# Patient Record
Sex: Female | Born: 1965 | Race: White | Hispanic: No | Smoking: Current every day smoker
Health system: Southern US, Community
[De-identification: ages and names within clinical notes are randomized; demographics above are authoritative.]

## PROBLEM LIST (undated history)

## (undated) DIAGNOSIS — M1990 Unspecified osteoarthritis, unspecified site: Secondary | ICD-10-CM

## (undated) DIAGNOSIS — E785 Hyperlipidemia, unspecified: Secondary | ICD-10-CM

## (undated) DIAGNOSIS — N3010 Interstitial cystitis (chronic) without hematuria: Secondary | ICD-10-CM

## (undated) DIAGNOSIS — F4310 Post-traumatic stress disorder, unspecified: Secondary | ICD-10-CM

## (undated) DIAGNOSIS — K449 Diaphragmatic hernia without obstruction or gangrene: Secondary | ICD-10-CM

## (undated) DIAGNOSIS — F419 Anxiety disorder, unspecified: Secondary | ICD-10-CM

## (undated) DIAGNOSIS — I10 Essential (primary) hypertension: Secondary | ICD-10-CM

## (undated) DIAGNOSIS — G43909 Migraine, unspecified, not intractable, without status migrainosus: Secondary | ICD-10-CM

## (undated) DIAGNOSIS — J449 Chronic obstructive pulmonary disease, unspecified: Secondary | ICD-10-CM

## (undated) DIAGNOSIS — R296 Repeated falls: Secondary | ICD-10-CM

## (undated) DIAGNOSIS — J189 Pneumonia, unspecified organism: Secondary | ICD-10-CM

## (undated) DIAGNOSIS — G8929 Other chronic pain: Secondary | ICD-10-CM

## (undated) DIAGNOSIS — F329 Major depressive disorder, single episode, unspecified: Secondary | ICD-10-CM

## (undated) DIAGNOSIS — W3400XA Accidental discharge from unspecified firearms or gun, initial encounter: Secondary | ICD-10-CM

## (undated) DIAGNOSIS — IMO0002 Reserved for concepts with insufficient information to code with codable children: Secondary | ICD-10-CM

## (undated) DIAGNOSIS — E119 Type 2 diabetes mellitus without complications: Secondary | ICD-10-CM

## (undated) DIAGNOSIS — K219 Gastro-esophageal reflux disease without esophagitis: Secondary | ICD-10-CM

## (undated) DIAGNOSIS — M549 Dorsalgia, unspecified: Secondary | ICD-10-CM

## (undated) DIAGNOSIS — R0602 Shortness of breath: Secondary | ICD-10-CM

## (undated) DIAGNOSIS — F32A Depression, unspecified: Secondary | ICD-10-CM

## (undated) HISTORY — PX: FOOT SURGERY: SHX648

## (undated) HISTORY — PX: ANTERIOR CERVICAL DECOMP/DISCECTOMY FUSION: SHX1161

## (undated) HISTORY — PX: DILATION AND CURETTAGE OF UTERUS: SHX78

---

## 1989-08-14 HISTORY — PX: TUBAL LIGATION: SHX77

## 1989-08-14 HISTORY — PX: ABDOMINAL HYSTERECTOMY: SHX81

## 1999-08-15 HISTORY — PX: CARPAL TUNNEL RELEASE: SHX101

## 2001-01-07 ENCOUNTER — Ambulatory Visit (HOSPITAL_COMMUNITY): Admission: RE | Admit: 2001-01-07 | Discharge: 2001-01-07 | Payer: Self-pay | Admitting: *Deleted

## 2001-01-19 ENCOUNTER — Encounter: Admission: RE | Admit: 2001-01-19 | Discharge: 2001-03-01 | Payer: Self-pay | Admitting: Orthopedic Surgery

## 2001-01-24 ENCOUNTER — Ambulatory Visit (HOSPITAL_BASED_OUTPATIENT_CLINIC_OR_DEPARTMENT_OTHER): Admission: RE | Admit: 2001-01-24 | Discharge: 2001-01-24 | Payer: Self-pay | Admitting: *Deleted

## 2001-02-09 ENCOUNTER — Ambulatory Visit (HOSPITAL_BASED_OUTPATIENT_CLINIC_OR_DEPARTMENT_OTHER): Admission: RE | Admit: 2001-02-09 | Discharge: 2001-02-09 | Payer: Self-pay | Admitting: *Deleted

## 2001-03-05 ENCOUNTER — Inpatient Hospital Stay (HOSPITAL_COMMUNITY): Admission: EM | Admit: 2001-03-05 | Discharge: 2001-03-09 | Payer: Self-pay | Admitting: *Deleted

## 2003-07-06 ENCOUNTER — Encounter: Payer: Self-pay | Admitting: Emergency Medicine

## 2003-07-06 ENCOUNTER — Inpatient Hospital Stay (HOSPITAL_COMMUNITY): Admission: EM | Admit: 2003-07-06 | Discharge: 2003-07-09 | Payer: Self-pay | Admitting: *Deleted

## 2006-01-24 ENCOUNTER — Emergency Department (HOSPITAL_COMMUNITY): Admission: EM | Admit: 2006-01-24 | Discharge: 2006-01-24 | Payer: Self-pay | Admitting: Emergency Medicine

## 2006-04-20 ENCOUNTER — Ambulatory Visit: Payer: Self-pay | Admitting: Nurse Practitioner

## 2006-05-18 ENCOUNTER — Ambulatory Visit: Payer: Self-pay | Admitting: Nurse Practitioner

## 2006-05-24 ENCOUNTER — Ambulatory Visit: Payer: Self-pay | Admitting: Nurse Practitioner

## 2006-06-09 ENCOUNTER — Ambulatory Visit: Payer: Self-pay | Admitting: Family Medicine

## 2006-06-17 ENCOUNTER — Ambulatory Visit: Payer: Self-pay | Admitting: Nurse Practitioner

## 2006-10-10 ENCOUNTER — Emergency Department (HOSPITAL_COMMUNITY): Admission: EM | Admit: 2006-10-10 | Discharge: 2006-10-10 | Payer: Self-pay | Admitting: Emergency Medicine

## 2006-11-24 ENCOUNTER — Ambulatory Visit: Payer: Self-pay | Admitting: Nurse Practitioner

## 2006-12-14 DIAGNOSIS — W3400XA Accidental discharge from unspecified firearms or gun, initial encounter: Secondary | ICD-10-CM

## 2006-12-14 DIAGNOSIS — J189 Pneumonia, unspecified organism: Secondary | ICD-10-CM

## 2006-12-14 HISTORY — DX: Pneumonia, unspecified organism: J18.9

## 2006-12-14 HISTORY — DX: Accidental discharge from unspecified firearms or gun, initial encounter: W34.00XA

## 2007-02-01 ENCOUNTER — Inpatient Hospital Stay (HOSPITAL_COMMUNITY): Admission: EM | Admit: 2007-02-01 | Discharge: 2007-02-22 | Payer: Self-pay | Admitting: Emergency Medicine

## 2007-02-10 ENCOUNTER — Ambulatory Visit: Payer: Self-pay | Admitting: Vascular Surgery

## 2007-02-21 ENCOUNTER — Ambulatory Visit: Payer: Self-pay | Admitting: Physical Medicine & Rehabilitation

## 2007-02-22 ENCOUNTER — Inpatient Hospital Stay (HOSPITAL_COMMUNITY)
Admission: RE | Admit: 2007-02-22 | Discharge: 2007-02-25 | Payer: Self-pay | Admitting: Physical Medicine & Rehabilitation

## 2007-03-04 ENCOUNTER — Ambulatory Visit: Payer: Self-pay | Admitting: Nurse Practitioner

## 2007-06-23 ENCOUNTER — Ambulatory Visit: Payer: Self-pay | Admitting: Internal Medicine

## 2007-06-28 ENCOUNTER — Ambulatory Visit (HOSPITAL_COMMUNITY): Admission: RE | Admit: 2007-06-28 | Discharge: 2007-06-28 | Payer: Self-pay | Admitting: Nurse Practitioner

## 2007-07-06 ENCOUNTER — Ambulatory Visit: Payer: Self-pay | Admitting: Internal Medicine

## 2007-07-12 ENCOUNTER — Ambulatory Visit (HOSPITAL_COMMUNITY): Admission: RE | Admit: 2007-07-12 | Discharge: 2007-07-12 | Payer: Self-pay | Admitting: Nurse Practitioner

## 2007-08-18 ENCOUNTER — Ambulatory Visit (HOSPITAL_COMMUNITY): Admission: RE | Admit: 2007-08-18 | Discharge: 2007-08-18 | Payer: Self-pay | Admitting: Nurse Practitioner

## 2007-08-24 ENCOUNTER — Ambulatory Visit (HOSPITAL_COMMUNITY): Admission: RE | Admit: 2007-08-24 | Discharge: 2007-08-24 | Payer: Self-pay | Admitting: Nurse Practitioner

## 2007-08-31 ENCOUNTER — Emergency Department (HOSPITAL_COMMUNITY): Admission: EM | Admit: 2007-08-31 | Discharge: 2007-09-01 | Payer: Self-pay | Admitting: Emergency Medicine

## 2007-12-01 ENCOUNTER — Ambulatory Visit: Payer: Self-pay | Admitting: Family Medicine

## 2007-12-06 ENCOUNTER — Ambulatory Visit (HOSPITAL_COMMUNITY): Admission: RE | Admit: 2007-12-06 | Discharge: 2007-12-06 | Payer: Self-pay | Admitting: Nurse Practitioner

## 2008-06-05 ENCOUNTER — Ambulatory Visit (HOSPITAL_BASED_OUTPATIENT_CLINIC_OR_DEPARTMENT_OTHER): Admission: RE | Admit: 2008-06-05 | Discharge: 2008-06-05 | Payer: Self-pay | Admitting: Urology

## 2008-08-13 ENCOUNTER — Encounter (INDEPENDENT_AMBULATORY_CARE_PROVIDER_SITE_OTHER): Payer: Self-pay | Admitting: Family Medicine

## 2008-08-13 ENCOUNTER — Ambulatory Visit: Payer: Self-pay | Admitting: Family Medicine

## 2008-08-13 LAB — CONVERTED CEMR LAB: Creatinine, Ser: 0.9 mg/dL (ref 0.40–1.20)

## 2008-08-14 ENCOUNTER — Ambulatory Visit: Payer: Self-pay | Admitting: Internal Medicine

## 2008-08-14 ENCOUNTER — Encounter (INDEPENDENT_AMBULATORY_CARE_PROVIDER_SITE_OTHER): Payer: Self-pay | Admitting: Family Medicine

## 2008-08-14 LAB — CONVERTED CEMR LAB
ALT: 54 units/L — ABNORMAL HIGH (ref 0–35)
AST: 51 units/L — ABNORMAL HIGH (ref 0–37)
Albumin: 4.3 g/dL (ref 3.5–5.2)
Alkaline Phosphatase: 101 units/L (ref 39–117)
BUN: 16 mg/dL (ref 6–23)
Calcium: 9.8 mg/dL (ref 8.4–10.5)
Chloride: 96 meq/L (ref 96–112)
HDL: 39 mg/dL — ABNORMAL LOW (ref >39)
LDL Cholesterol: 109 mg/dL — ABNORMAL HIGH (ref 0–99)
Microalb, Ur: 2.9 mg/dL — ABNORMAL HIGH (ref 0.00–1.89)
Potassium: 3.4 meq/L — ABNORMAL LOW (ref 3.5–5.3)
Sodium: 132 meq/L — ABNORMAL LOW (ref 135–145)
Total Protein: 7.5 g/dL (ref 6.0–8.3)

## 2008-08-15 ENCOUNTER — Encounter (INDEPENDENT_AMBULATORY_CARE_PROVIDER_SITE_OTHER): Payer: Self-pay | Admitting: Family Medicine

## 2008-08-15 LAB — CONVERTED CEMR LAB
HCV Ab: NEGATIVE
Hepatitis B Surface Ag: NEGATIVE
Hepatitis B-Post: 0 milliintl units/mL

## 2008-09-22 ENCOUNTER — Emergency Department (HOSPITAL_COMMUNITY): Admission: EM | Admit: 2008-09-22 | Discharge: 2008-09-22 | Payer: Self-pay | Admitting: Emergency Medicine

## 2008-10-24 ENCOUNTER — Encounter (INDEPENDENT_AMBULATORY_CARE_PROVIDER_SITE_OTHER): Payer: Self-pay | Admitting: Adult Health

## 2008-10-24 ENCOUNTER — Ambulatory Visit: Payer: Self-pay | Admitting: Internal Medicine

## 2008-10-24 LAB — CONVERTED CEMR LAB
ALT: 13 units/L (ref 0–35)
Albumin: 4.2 g/dL (ref 3.5–5.2)
Alkaline Phosphatase: 85 units/L (ref 39–117)
Basophils Absolute: 0 10*3/uL (ref 0.0–0.1)
CO2: 22 meq/L (ref 19–32)
Eosinophils Relative: 2 % (ref 0–5)
HCT: 40.4 % (ref 36.0–46.0)
Lymphocytes Relative: 39 % (ref 12–46)
Lymphs Abs: 3.9 10*3/uL (ref 0.7–4.0)
Neutrophils Relative %: 53 % (ref 43–77)
Platelets: 356 10*3/uL (ref 150–400)
Potassium: 3.5 meq/L (ref 3.5–5.3)
RDW: 15 % (ref 11.5–15.5)
Sodium: 140 meq/L (ref 135–145)
TSH: 2.265 microintl units/mL (ref 0.350–4.50)
Total Bilirubin: 0.3 mg/dL (ref 0.3–1.2)
Total Protein: 7.4 g/dL (ref 6.0–8.3)
WBC: 10.1 10*3/uL (ref 4.0–10.5)

## 2008-10-31 ENCOUNTER — Ambulatory Visit (HOSPITAL_COMMUNITY): Admission: RE | Admit: 2008-10-31 | Discharge: 2008-10-31 | Payer: Self-pay | Admitting: Internal Medicine

## 2008-11-14 ENCOUNTER — Encounter (INDEPENDENT_AMBULATORY_CARE_PROVIDER_SITE_OTHER): Payer: Self-pay | Admitting: Adult Health

## 2008-11-14 ENCOUNTER — Ambulatory Visit: Payer: Self-pay | Admitting: Internal Medicine

## 2008-11-14 LAB — CONVERTED CEMR LAB
Total CHOL/HDL Ratio: 4.9
VLDL: 45 mg/dL — ABNORMAL HIGH (ref 0–40)

## 2009-01-31 ENCOUNTER — Ambulatory Visit: Payer: Self-pay | Admitting: Family Medicine

## 2009-02-08 ENCOUNTER — Ambulatory Visit (HOSPITAL_COMMUNITY): Admission: RE | Admit: 2009-02-08 | Discharge: 2009-02-08 | Payer: Self-pay | Admitting: Family Medicine

## 2009-03-06 ENCOUNTER — Ambulatory Visit: Payer: Self-pay | Admitting: Internal Medicine

## 2009-03-06 ENCOUNTER — Encounter (INDEPENDENT_AMBULATORY_CARE_PROVIDER_SITE_OTHER): Payer: Self-pay | Admitting: Adult Health

## 2009-03-06 LAB — CONVERTED CEMR LAB
ALT: 15 units/L (ref 0–35)
BUN: 19 mg/dL (ref 6–23)
CO2: 22 meq/L (ref 19–32)
Calcium: 9.8 mg/dL (ref 8.4–10.5)
Chlamydia, DNA Probe: NEGATIVE
Chloride: 102 meq/L (ref 96–112)
Cholesterol: 182 mg/dL (ref 0–200)
Creatinine, Ser: 0.67 mg/dL (ref 0.40–1.20)
GC Probe Amp, Genital: NEGATIVE
Glucose, Bld: 117 mg/dL — ABNORMAL HIGH (ref 70–99)
HDL: 48 mg/dL (ref >39)
Total CHOL/HDL Ratio: 3.8
Triglycerides: 116 mg/dL (ref <150)

## 2009-06-06 ENCOUNTER — Emergency Department (HOSPITAL_COMMUNITY): Admission: EM | Admit: 2009-06-06 | Discharge: 2009-06-07 | Payer: Self-pay | Admitting: Emergency Medicine
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
## 2009-06-24 ENCOUNTER — Emergency Department (HOSPITAL_COMMUNITY): Admission: EM | Admit: 2009-06-24 | Discharge: 2009-06-25 | Payer: Self-pay | Admitting: Emergency Medicine

## 2009-07-14 ENCOUNTER — Emergency Department (HOSPITAL_COMMUNITY): Admission: EM | Admit: 2009-07-14 | Discharge: 2009-07-14 | Payer: Self-pay | Admitting: Emergency Medicine

## 2009-07-18 ENCOUNTER — Encounter: Admission: RE | Admit: 2009-07-18 | Discharge: 2009-09-02 | Payer: Self-pay | Admitting: Neurological Surgery

## 2009-09-06 ENCOUNTER — Encounter (INDEPENDENT_AMBULATORY_CARE_PROVIDER_SITE_OTHER): Payer: Self-pay | Admitting: Adult Health

## 2009-09-06 ENCOUNTER — Ambulatory Visit: Payer: Self-pay | Admitting: Family Medicine

## 2009-09-06 LAB — CONVERTED CEMR LAB
ALT: 16 units/L (ref 0–35)
CO2: 22 meq/L (ref 19–32)
Calcium: 9.8 mg/dL (ref 8.4–10.5)
Chloride: 103 meq/L (ref 96–112)
Cholesterol: 168 mg/dL (ref 0–200)
Creatinine, Ser: 0.77 mg/dL (ref 0.40–1.20)
Total CHOL/HDL Ratio: 4.7
Total Protein: 7.2 g/dL (ref 6.0–8.3)

## 2009-09-07 ENCOUNTER — Encounter (INDEPENDENT_AMBULATORY_CARE_PROVIDER_SITE_OTHER): Payer: Self-pay | Admitting: Adult Health

## 2009-11-25 ENCOUNTER — Emergency Department (HOSPITAL_COMMUNITY): Admission: EM | Admit: 2009-11-25 | Discharge: 2009-11-25 | Payer: Self-pay | Admitting: Emergency Medicine

## 2009-12-09 ENCOUNTER — Emergency Department (HOSPITAL_COMMUNITY): Admission: EM | Admit: 2009-12-09 | Discharge: 2009-12-09 | Payer: Self-pay | Admitting: Emergency Medicine

## 2009-12-10 ENCOUNTER — Emergency Department (HOSPITAL_COMMUNITY): Admission: EM | Admit: 2009-12-10 | Discharge: 2009-12-10 | Payer: Self-pay | Admitting: Family Medicine

## 2010-02-24 ENCOUNTER — Ambulatory Visit: Payer: Self-pay | Admitting: Adult Health

## 2010-05-21 ENCOUNTER — Ambulatory Visit: Payer: Self-pay | Admitting: Internal Medicine

## 2010-05-21 LAB — CONVERTED CEMR LAB
Albumin: 4.2 g/dL (ref 3.5–5.2)
CO2: 20 meq/L (ref 19–32)
Calcium: 9.2 mg/dL (ref 8.4–10.5)
Cholesterol: 146 mg/dL (ref 0–200)
Glucose, Bld: 154 mg/dL — ABNORMAL HIGH (ref 70–99)
Potassium: 4 meq/L (ref 3.5–5.3)
Sodium: 136 meq/L (ref 135–145)
Total Protein: 7 g/dL (ref 6.0–8.3)
Triglycerides: 102 mg/dL (ref <150)

## 2010-05-27 ENCOUNTER — Encounter (INDEPENDENT_AMBULATORY_CARE_PROVIDER_SITE_OTHER): Payer: Self-pay | Admitting: Adult Health

## 2010-05-27 ENCOUNTER — Ambulatory Visit: Payer: Self-pay | Admitting: Internal Medicine

## 2010-05-27 LAB — CONVERTED CEMR LAB
Basophils Relative: 0 % (ref 0–1)
Eosinophils Absolute: 0.2 10*3/uL (ref 0.0–0.7)
Hemoglobin: 13.3 g/dL (ref 12.0–15.0)
MCHC: 31.5 g/dL (ref 30.0–36.0)
MCV: 95.7 fL (ref 78.0–100.0)
Monocytes Absolute: 0.7 10*3/uL (ref 0.1–1.0)
Monocytes Relative: 6 % (ref 3–12)
RBC: 4.41 M/uL (ref 3.87–5.11)
Sed Rate: 20 mm/hr (ref 0–22)

## 2010-05-29 ENCOUNTER — Ambulatory Visit: Payer: Self-pay | Admitting: Internal Medicine

## 2010-06-01 ENCOUNTER — Emergency Department (HOSPITAL_COMMUNITY): Admission: EM | Admit: 2010-06-01 | Discharge: 2010-06-01 | Payer: Self-pay | Admitting: Emergency Medicine

## 2010-06-02 ENCOUNTER — Ambulatory Visit (HOSPITAL_COMMUNITY): Admission: RE | Admit: 2010-06-02 | Discharge: 2010-06-02 | Payer: Self-pay | Admitting: Internal Medicine

## 2010-06-18 ENCOUNTER — Emergency Department (HOSPITAL_COMMUNITY): Admission: EM | Admit: 2010-06-18 | Discharge: 2010-06-18 | Payer: Self-pay | Admitting: Emergency Medicine

## 2010-07-12 IMAGING — CR DG FOOT COMPLETE 3+V*L*
3 series · 3 of 3 positions shown · non-contrast
Comparison: None

CLINICAL DATA: Left foot injury 2 days ago

LEFT FOOT - COMPLETE 3+ VIEW

[view not recorded (1 of 3)]
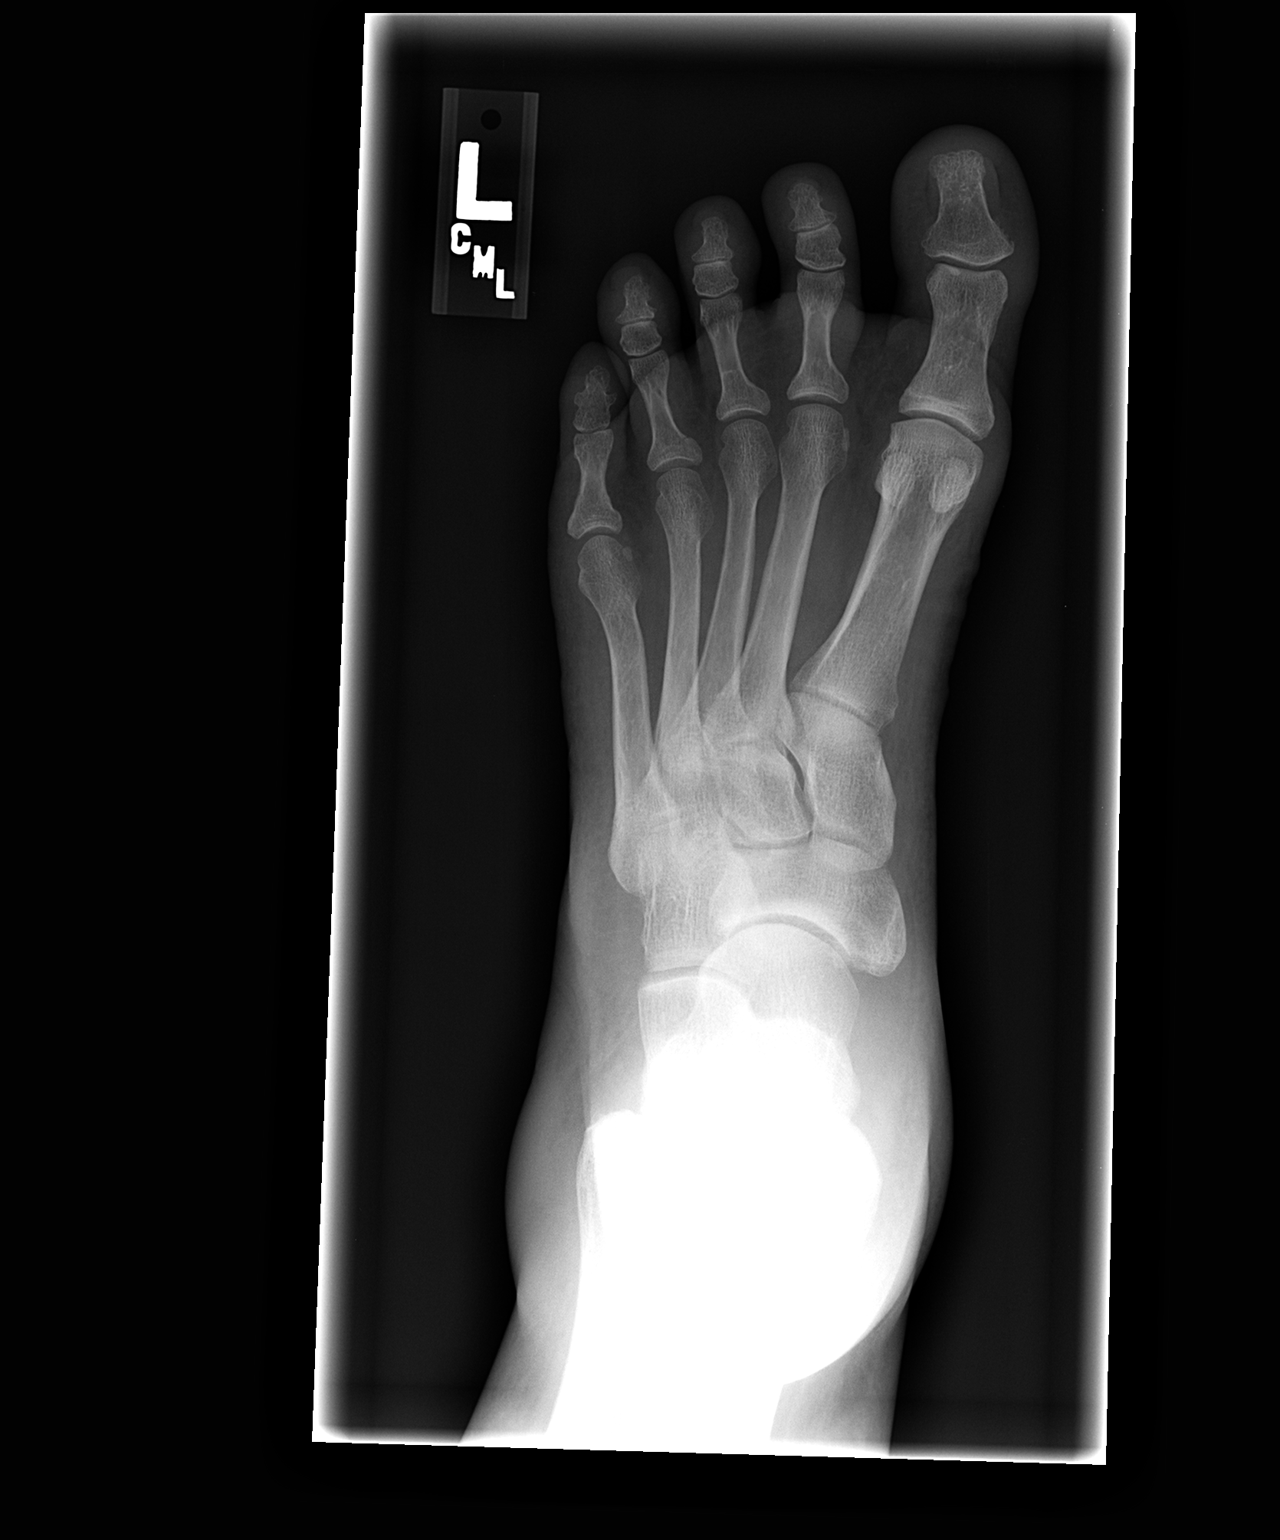

[view not recorded (2 of 3)]
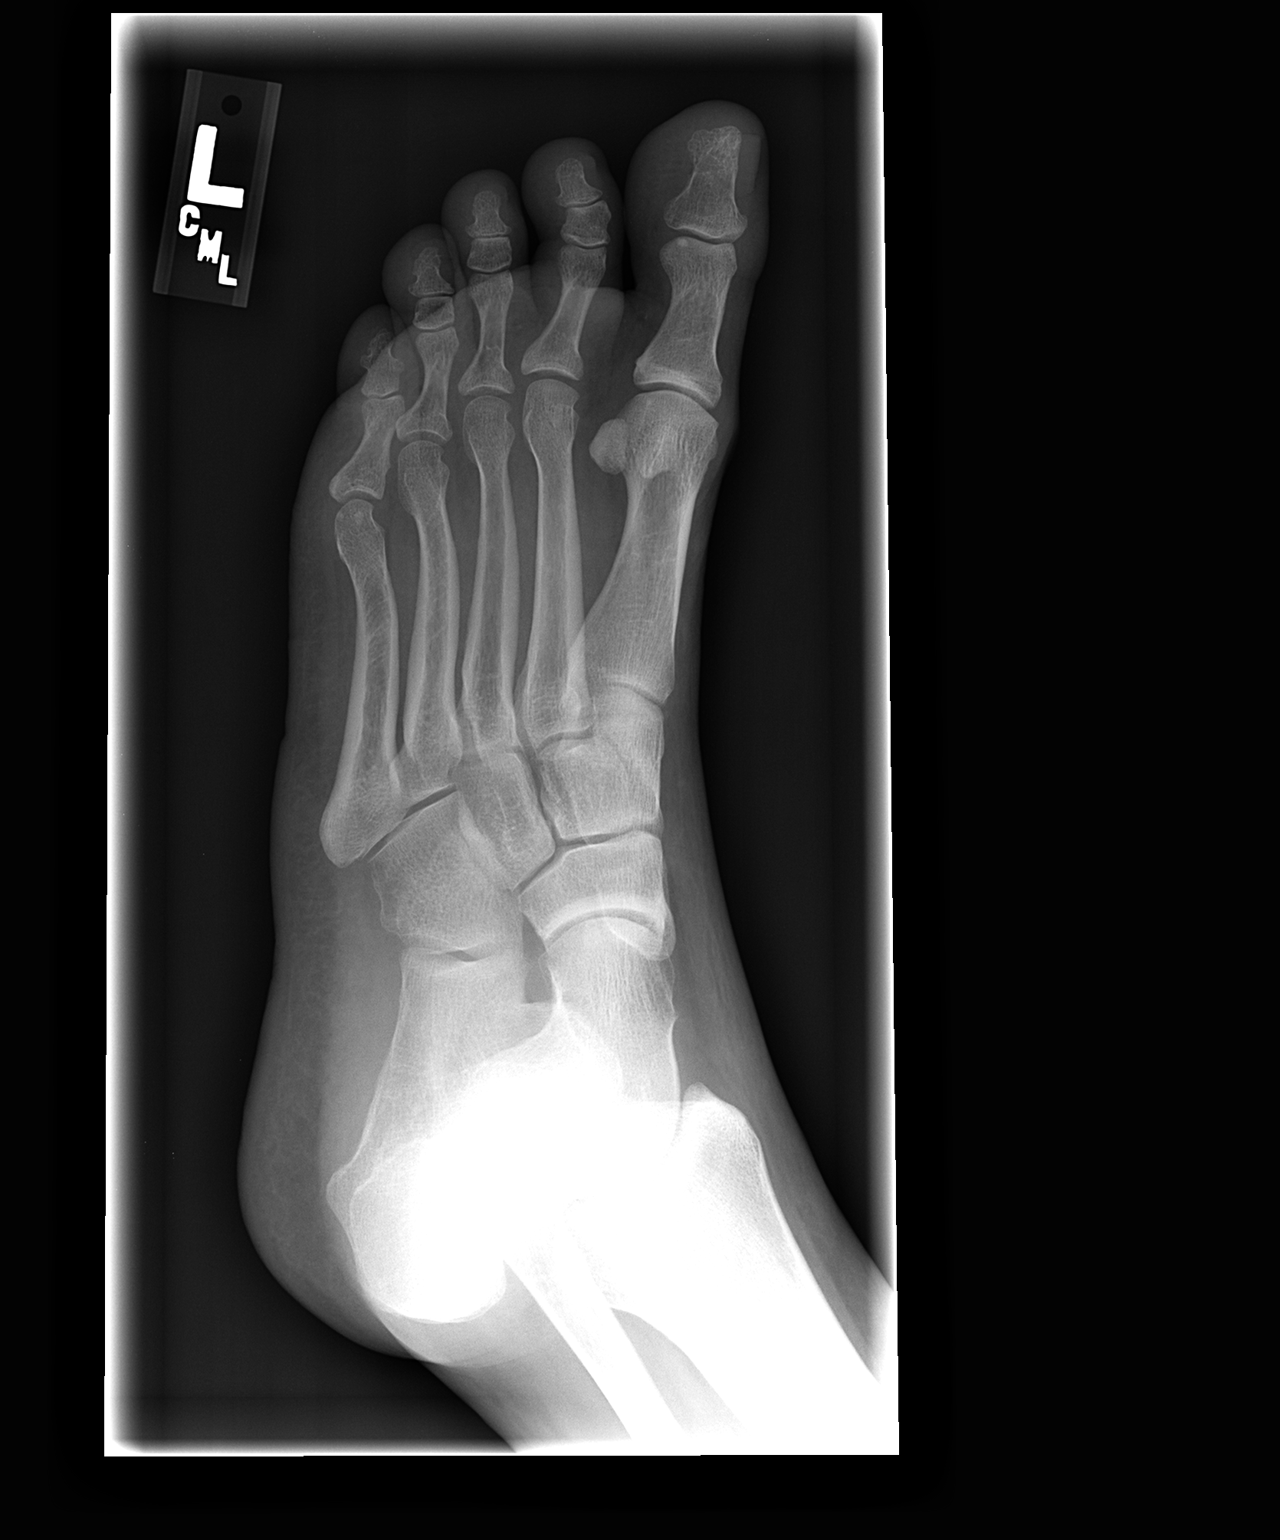

[view not recorded (3 of 3)]
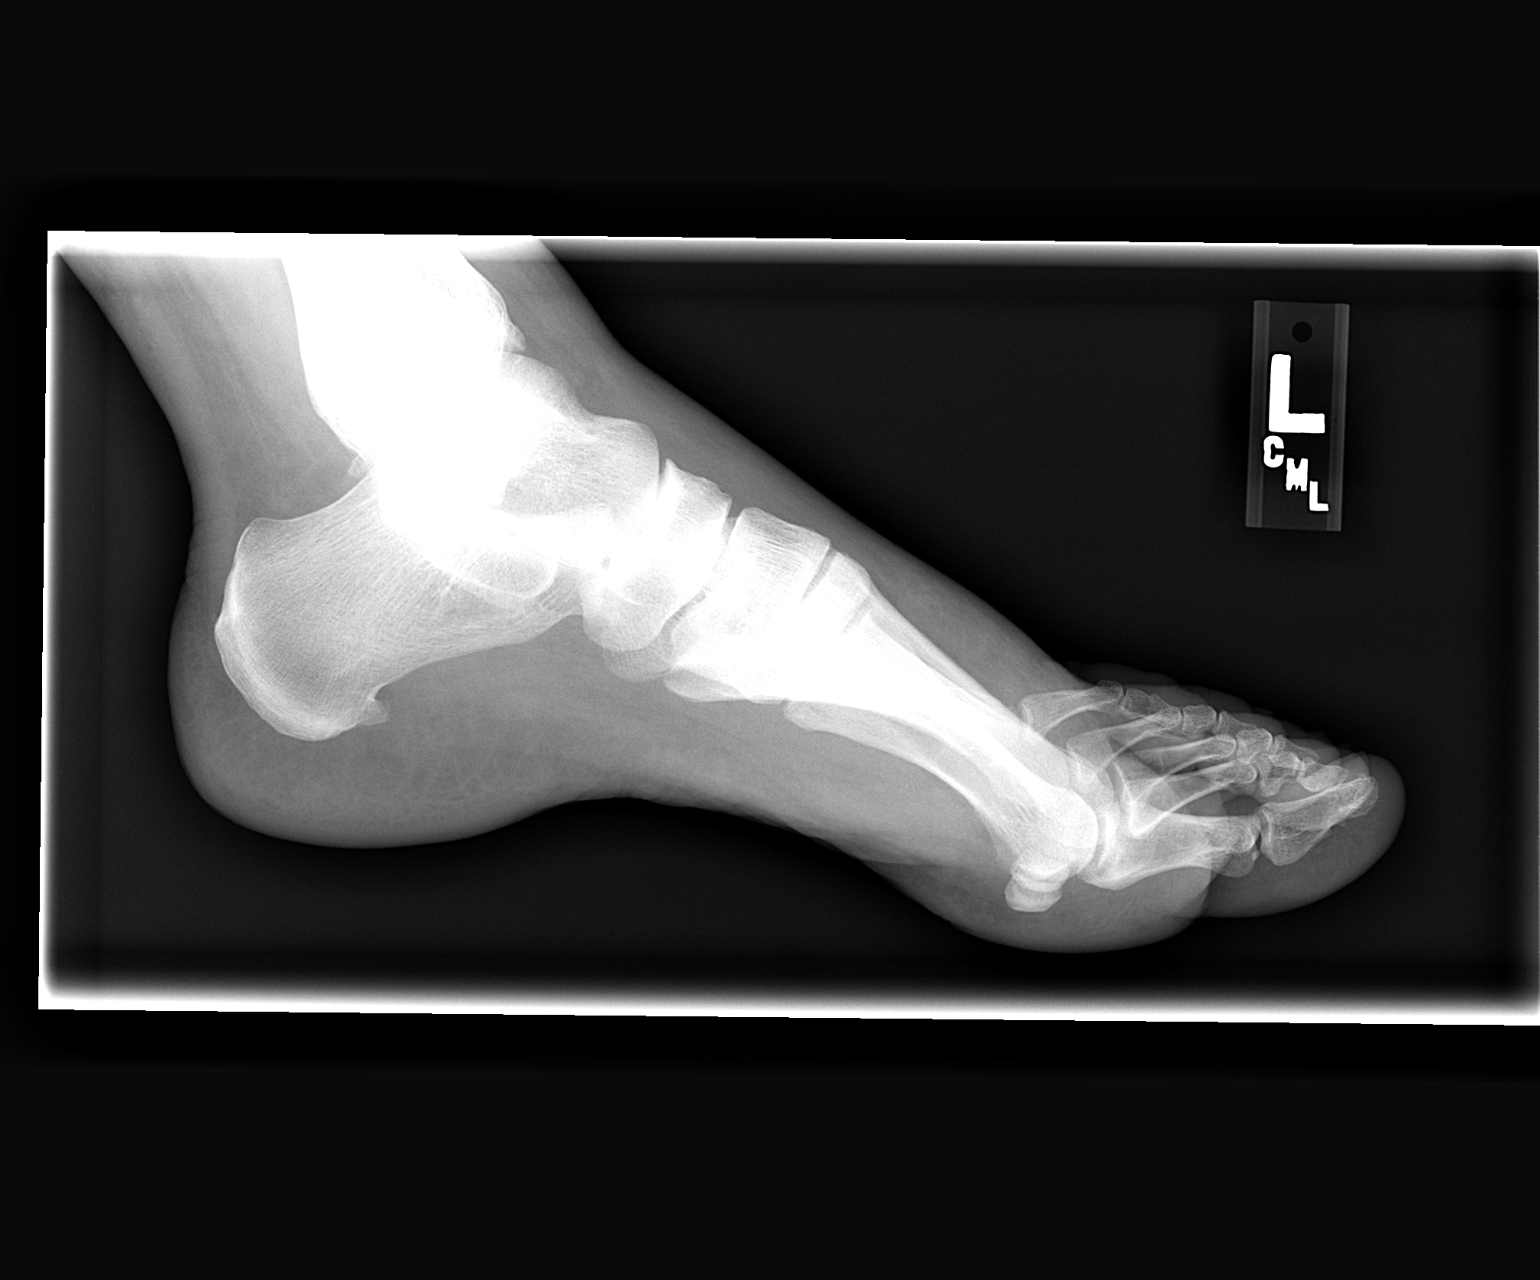

[3 of 3 positions shown; findings below may reference images not displayed]

FINDINGS: No definitive fracture.  Subtle hairline lucency medial
aspect of the base of the proximal phalanx of the fourth toe.  Does
the patient have point tenderness at that site?
IMPRESSION: Only moderate suspicion for hairline fracture proximal aspect of
the fourth proximal phalanx.  Recommend clinical correlation.

## 2010-11-20 ENCOUNTER — Emergency Department (HOSPITAL_COMMUNITY): Admission: EM | Admit: 2010-11-20 | Discharge: 2009-12-15 | Payer: Self-pay | Admitting: Emergency Medicine

## 2011-01-04 ENCOUNTER — Encounter: Payer: Self-pay | Admitting: Internal Medicine

## 2011-01-28 ENCOUNTER — Other Ambulatory Visit (HOSPITAL_COMMUNITY): Payer: Self-pay | Admitting: Family Medicine

## 2011-01-28 ENCOUNTER — Encounter (INDEPENDENT_AMBULATORY_CARE_PROVIDER_SITE_OTHER): Payer: Self-pay | Admitting: *Deleted

## 2011-01-28 DIAGNOSIS — J329 Chronic sinusitis, unspecified: Secondary | ICD-10-CM

## 2011-01-28 LAB — CONVERTED CEMR LAB
BUN: 11 mg/dL (ref 6–23)
Chloride: 99 meq/L (ref 96–112)
Glucose, Bld: 148 mg/dL — ABNORMAL HIGH (ref 70–99)
Potassium: 3.7 meq/L (ref 3.5–5.3)
Sodium: 140 meq/L (ref 135–145)

## 2011-02-02 ENCOUNTER — Ambulatory Visit (HOSPITAL_COMMUNITY): Admission: RE | Admit: 2011-02-02 | Payer: Medicare Other | Source: Ambulatory Visit

## 2011-02-03 ENCOUNTER — Ambulatory Visit (HOSPITAL_COMMUNITY)
Admission: RE | Admit: 2011-02-03 | Discharge: 2011-02-03 | Disposition: A | Discharge status: Home / Self Care | Payer: Medicare Other | Source: Ambulatory Visit | Attending: Family Medicine | Admitting: Family Medicine

## 2011-02-03 DIAGNOSIS — J329 Chronic sinusitis, unspecified: Secondary | ICD-10-CM | POA: Insufficient documentation

## 2011-02-03 DIAGNOSIS — H9209 Otalgia, unspecified ear: Secondary | ICD-10-CM | POA: Insufficient documentation

## 2011-02-03 DIAGNOSIS — R070 Pain in throat: Secondary | ICD-10-CM | POA: Insufficient documentation

## 2011-03-03 ENCOUNTER — Emergency Department (HOSPITAL_COMMUNITY): Payer: Medicare Other

## 2011-03-03 ENCOUNTER — Emergency Department (HOSPITAL_COMMUNITY)
Admission: EM | Admit: 2011-03-03 | Discharge: 2011-03-04 | Disposition: A | Discharge status: Home / Self Care | Payer: Medicare Other | Attending: Emergency Medicine | Admitting: Emergency Medicine

## 2011-03-03 DIAGNOSIS — E119 Type 2 diabetes mellitus without complications: Secondary | ICD-10-CM | POA: Insufficient documentation

## 2011-03-03 DIAGNOSIS — W010XXA Fall on same level from slipping, tripping and stumbling without subsequent striking against object, initial encounter: Secondary | ICD-10-CM | POA: Insufficient documentation

## 2011-03-03 DIAGNOSIS — I10 Essential (primary) hypertension: Secondary | ICD-10-CM | POA: Insufficient documentation

## 2011-03-03 DIAGNOSIS — F411 Generalized anxiety disorder: Secondary | ICD-10-CM | POA: Insufficient documentation

## 2011-03-03 DIAGNOSIS — T07XXXA Unspecified multiple injuries, initial encounter: Secondary | ICD-10-CM | POA: Insufficient documentation

## 2011-03-03 DIAGNOSIS — IMO0002 Reserved for concepts with insufficient information to code with codable children: Secondary | ICD-10-CM | POA: Insufficient documentation

## 2011-03-03 DIAGNOSIS — F17200 Nicotine dependence, unspecified, uncomplicated: Secondary | ICD-10-CM | POA: Insufficient documentation

## 2011-03-09 ENCOUNTER — Other Ambulatory Visit (HOSPITAL_COMMUNITY): Payer: Self-pay | Admitting: Family Medicine

## 2011-03-09 ENCOUNTER — Ambulatory Visit (HOSPITAL_COMMUNITY)
Admission: RE | Admit: 2011-03-09 | Discharge: 2011-03-09 | Disposition: A | Discharge status: Home / Self Care | Payer: Medicare Other | Source: Ambulatory Visit | Attending: Family Medicine | Admitting: Family Medicine

## 2011-03-09 DIAGNOSIS — M25559 Pain in unspecified hip: Secondary | ICD-10-CM | POA: Insufficient documentation

## 2011-03-09 DIAGNOSIS — R52 Pain, unspecified: Secondary | ICD-10-CM

## 2011-03-22 LAB — URINALYSIS, ROUTINE W REFLEX MICROSCOPIC
Glucose, UA: NEGATIVE mg/dL
Protein, ur: NEGATIVE mg/dL
pH: 5.5 (ref 5.0–8.0)

## 2011-03-22 LAB — BASIC METABOLIC PANEL
Chloride: 100 mEq/L (ref 96–112)
GFR calc Af Amer: >60 mL/min (ref >60)
Potassium: 3.7 mEq/L (ref 3.5–5.1)
Sodium: 133 mEq/L — ABNORMAL LOW (ref 135–145)

## 2011-03-24 ENCOUNTER — Other Ambulatory Visit (HOSPITAL_COMMUNITY): Payer: Self-pay | Admitting: Neurosurgery

## 2011-03-24 DIAGNOSIS — M545 Low back pain: Secondary | ICD-10-CM

## 2011-03-24 DIAGNOSIS — M5412 Radiculopathy, cervical region: Secondary | ICD-10-CM

## 2011-03-26 ENCOUNTER — Emergency Department (HOSPITAL_COMMUNITY)
Admission: EM | Admit: 2011-03-26 | Discharge: 2011-03-27 | Disposition: A | Discharge status: Home / Self Care | Payer: Medicare Other | Attending: Emergency Medicine | Admitting: Emergency Medicine

## 2011-03-26 DIAGNOSIS — E119 Type 2 diabetes mellitus without complications: Secondary | ICD-10-CM | POA: Insufficient documentation

## 2011-03-26 DIAGNOSIS — M545 Low back pain, unspecified: Secondary | ICD-10-CM | POA: Insufficient documentation

## 2011-03-26 DIAGNOSIS — I10 Essential (primary) hypertension: Secondary | ICD-10-CM | POA: Insufficient documentation

## 2011-03-26 DIAGNOSIS — K219 Gastro-esophageal reflux disease without esophagitis: Secondary | ICD-10-CM | POA: Insufficient documentation

## 2011-03-26 DIAGNOSIS — M5430 Sciatica, unspecified side: Secondary | ICD-10-CM | POA: Insufficient documentation

## 2011-03-26 DIAGNOSIS — E785 Hyperlipidemia, unspecified: Secondary | ICD-10-CM | POA: Insufficient documentation

## 2011-03-26 DIAGNOSIS — G8929 Other chronic pain: Secondary | ICD-10-CM | POA: Insufficient documentation

## 2011-03-28 ENCOUNTER — Ambulatory Visit (HOSPITAL_COMMUNITY): Payer: Medicare Other

## 2011-03-28 ENCOUNTER — Ambulatory Visit (HOSPITAL_COMMUNITY)
Admission: RE | Admit: 2011-03-28 | Discharge: 2011-03-28 | Disposition: A | Discharge status: Home / Self Care | Payer: Medicare Other | Source: Ambulatory Visit | Attending: Neurosurgery | Admitting: Neurosurgery

## 2011-03-28 DIAGNOSIS — M545 Low back pain, unspecified: Secondary | ICD-10-CM

## 2011-03-28 DIAGNOSIS — M5137 Other intervertebral disc degeneration, lumbosacral region: Secondary | ICD-10-CM | POA: Insufficient documentation

## 2011-03-28 DIAGNOSIS — W19XXXA Unspecified fall, initial encounter: Secondary | ICD-10-CM | POA: Insufficient documentation

## 2011-03-28 DIAGNOSIS — M51379 Other intervertebral disc degeneration, lumbosacral region without mention of lumbar back pain or lower extremity pain: Secondary | ICD-10-CM | POA: Insufficient documentation

## 2011-03-28 LAB — CREATININE, SERUM
GFR calc Af Amer: >60 mL/min (ref >60)
GFR calc non Af Amer: >60 mL/min (ref >60)

## 2011-03-29 ENCOUNTER — Ambulatory Visit
Admission: RE | Admit: 2011-03-29 | Discharge: 2011-03-29 | Disposition: A | Discharge status: Home / Self Care | Payer: Medicare Other | Source: Ambulatory Visit | Attending: Neurosurgery | Admitting: Neurosurgery

## 2011-03-29 DIAGNOSIS — M5412 Radiculopathy, cervical region: Secondary | ICD-10-CM

## 2011-03-29 MED ORDER — GADOBENATE DIMEGLUMINE 529 MG/ML IV SOLN
20.0000 mL | Freq: Once | INTRAVENOUS | Status: AC | PRN
  Administered 2011-03-29: 20 mL via INTRAVENOUS

## 2011-04-03 ENCOUNTER — Ambulatory Visit: Payer: Medicare Other | Admitting: Physical Therapy

## 2011-04-15 ENCOUNTER — Ambulatory Visit: Payer: Medicare Other | Attending: Neurosurgery | Admitting: Physical Therapy

## 2011-04-15 DIAGNOSIS — IMO0001 Reserved for inherently not codable concepts without codable children: Secondary | ICD-10-CM | POA: Insufficient documentation

## 2011-04-15 DIAGNOSIS — R293 Abnormal posture: Secondary | ICD-10-CM | POA: Insufficient documentation

## 2011-04-15 DIAGNOSIS — M2560 Stiffness of unspecified joint, not elsewhere classified: Secondary | ICD-10-CM | POA: Insufficient documentation

## 2011-04-15 DIAGNOSIS — M542 Cervicalgia: Secondary | ICD-10-CM | POA: Insufficient documentation

## 2011-04-22 ENCOUNTER — Ambulatory Visit: Payer: Medicare Other | Admitting: Physical Therapy

## 2011-04-28 ENCOUNTER — Ambulatory Visit: Payer: Medicare Other | Admitting: Physical Therapy

## 2011-04-28 NOTE — Op Note (Signed)
NAMEHUDSON, Alison Kim              ACCOUNT NO.:  [id number]   MEDICAL RECORD NO.:  [id number]          PATIENT TYPE:  AMB   LOCATION:  [facility]                         FACILITY:  [facility]   PHYSICIAN:  Martina Sinner, MD DATE OF BIRTH:  1966-07-21   DATE OF PROCEDURE:  06/05/2008  DATE OF DISCHARGE:                               OPERATIVE REPORT   PREOPERATIVE DIAGNOSIS:  Pelvic pain.   POSTOPERATIVE DIAGNOSES:  Pelvic pain; interstitial cystitis.   SURGERY:  Cystoscopy, hydrodistention, bladder installation therapy.   INDICATIONS:  Alison Kim has mixed stress urge incontinence and  vague pelvic pain and dyspareunia.  She has marked frequency.   DESCRIPTION OF PROCEDURE:  The patient was prepped and draped in the  usual fashion.  A 21-French scope was utilized.  Initial inspection of  the bladder was normal.  She had been given preoperative antibiotics.   I tried twice to hydrodistend her, but in spite of having deep  anesthesia, I only could fill her to 200 mL, even by gently squeezing  the urethra around the cystoscope.  Her bladder was very spastic and she  wanted to leak around the scope even at lower volumes.  I did not want  to perforate her bladder.  I did this twice.  There was only 200-250 mL  of return.  On reinspection of the bladder on both occasions, she had  glomerulations at 5 and 7 o'clock and a few in the trigone.  She did not  have glomerulations in the dome.  The findings were not due to the  cystoscope.   In my opinion, Alison Kim's spastic bladder and glomerulations are in  keeping with the diagnosis of interstitial cystitis.  Her mixed stress  urge incontinence, pelvic pain and frequency will be addressed when she  returns to the clinic in approximately a week.           ______________________________  Martina Sinner, MD  Electronically Signed     SAM/MEDQ  D:  06/05/2008  T:  06/05/2008  Job:  [id number]

## 2011-04-30 ENCOUNTER — Ambulatory Visit: Payer: Medicare Other | Admitting: Physical Therapy
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
## 2011-05-01 NOTE — Discharge Summary (Signed)
[facility]  Patient:    Alison Kim, Alison Kim                     MRN: [id number] Adm. Date:  [id number] Disc. Date: [id number] Attending:  Jasmine Pang CC:         De Nurse, M.D., [facility]   Discharge Summary  CONTINUATION:  (hospital course):  On March 06, 2001, patients Zoloft was increased to 150 mg p.o. q.a.m.  She was also begun on a Ventolin inhaler as well ______ 500 mg 2 pills p.o. now, then 500 mg p.o. b.i.d. for pain from carpal tunnel syndrome.  On March 08, 2001, her Zoloft was increased to 200 mg q.a.m.  She states she had been on this in the past and it was helpful.  Her Trazodone was also increased to 100 mg p.o. q.h.s. due to continued insomnia.  Patient tolerated her medications well with no significant side effects.  Her mood improved as the hospitalization progressed.  She was able to participate in unit therapeutic groups and activities after several days.  She focused primarily on her multiple stressors and alternative ways to cope with them. At the time of discharge, she was felt ready to be managed in a less restrictive setting.  DISCHARGE MENTAL STATUS EXAMINATION:  Patient had better eye contact.  Her psychomotor retardation had resolved.  Speech was still soft and slow but improved from admission.  She was more spontaneous.  Mood was less depressed and anxious, affect wider range, with ability to smile at appropriate times. There was no suicidal or homicidal ideation.  There was no self-injurious behavior or aggression.  There was no psychosis or perceptual disturbance. Thought processes were logical and goal directed.  Cognitive exam was back to baseline.  Insight fair, judgment fair.  DISCHARGE DIAGNOSES: Axis I:     Bipolar disorder, most recent episode depressed, severe. Axis II:    None. Axis II:    Bronchial asthma, gastroesophageal reflux disease, seasonal             allergies, knee  pain from pinched sciatic nerve. Axis IV:    Severe. Axis V:     Current global assessment of function at admission 10, highest             past year is 60, discharge was 50.   DISCHARGE MEDICATIONS: 1. Zoloft 200 mg p.o. q.a.m. 2. Trazodone 100 mg p.o. qhs 3. Diazepam 5 mg p.o. b.i.d. 4. In addition, her numerous medications as prescribed by her primary care    physician as outlined in the history of present illness were continued (see    history of present illness for these medications and their doses.  ACTIVITY LEVEL:  No restrictions.  DIET:  No restrictions.  POST HOSPITAL CARE PLAN:  Patient will follow up at [facility] on Thursday, April 4 at 10 a.m. DD:  04/10/01 TD:  04/11/01 Job: [id number]

## 2011-05-01 NOTE — Op Note (Signed)
[place]  Patient:    Alison Kim, Alison Kim                       MRN: [id number] Proc. Date: 02/09/01 Adm. Date:  [id number] Attending:  Kendell Bane                           Operative Report  PREOPERATIVE DIAGNOSIS:  Left carpal tunnel syndrome.  POSTOPERATIVE DIAGNOSIS:  Left carpal tunnel syndrome.  OPERATION PERFORMED:  Decompression, median nerve, left carpal tunnel.  SURGEON:  Lowell Bouton, M.D.  ANESTHESIA:  0.5% Marcaine local with sedation.  OPERATIVE FINDINGS:  The patient had a very narrow carpal canal.  There were no masses present.  The motor branch was intact.  DESCRIPTION OF PROCEDURE:  Under 0.5% Marcaine local anesthesia with the tourniquet on the left arm, the left hand was prepped and draped in the usual fashion.  After exsanguinating the limb, the tourniquet was inflated to 250 mmHg.  A longitudinal incision was made in the palm just ulnar to the thenar crease and carried down through the subcutaneous tissues.  Bleeding points were coagulated.  Blunt dissection was carried through the superficial palmar fascia distal to the transverse carpal ligament and a hemostat was placed in the carpal canal up against the hook of the hamate.  The transverse carpal ligament was then divided on the ulnar border of the median nerve.  The proximal end of the ligament was divided with the scissors after dissecting the nerve away from the undersurface of the ligament.  The carpal canal ws then palpated.  It was found to be adequately decompressed.  The nerve was examined and the motor branch was identified.  The wound was irrigated with saline.  The skin was closed with 4-0 nylon sutures.  Sterile dressings were applied followed by a volar wrist splint.  The patient tolerated the procedure well and went to the recovery room awake and stable in good condition. DD:  02/09/01 TD:  02/09/01 Job: [id number]

## 2011-05-01 NOTE — Discharge Summary (Signed)
NAME:  Alison Kim, Alison Kim                        ACCOUNT NO.:  [id number]   MEDICAL RECORD NO.:  [id number]                   PATIENT TYPE:  INP   LOCATION:  5007                                 FACILITY:  [facility]   PHYSICIAN:  Jene Every, M.D.                 DATE OF BIRTH:  01-14-1966   DATE OF ADMISSION:  07/06/2003  DATE OF DISCHARGE:  07/09/2003                                 DISCHARGE SUMMARY   ADMISSION DIAGNOSES:  1. Foreign body left foot.  2. Asthma.  3. Chronic low back pain.  4. Gastroesophageal reflux disease.  5. Anxiety.  6. Depression.   DISCHARGE DIAGNOSES:  1. Pitchfork, left foot status post irrigation and debridement, removal of     pitchfork.  2. Asthma.  3. Chronic low back pain.  4. Severe depression.  5. Gastroesophageal reflux disease.   HISTORY:  The patient is a 45 year old female who presented to the emergency  room with a pitchfork in her foot.  The patient states she slipped off her  chair and landed on the pitchfork.  She was seen in the emergency room by  Jene Every, M.D. and taken directly to the OR for removal of the  pitchfork as well as irrigation and debridement of the area.  The risks and  benefits of the surgery as well as postoperative complications were  discussed with the patient and she wishes to proceed.   PROCEDURE:  The patient was taken to the OR on July 06, 2003 for removal of  the pitchfork as well as irrigation and debridement.  Surgeon was Jene Every, M.D.  Anesthesia general.  Complications:  None.   CONSULTATIONS:  PT/OT.   PREOPERATIVE LABORATORIES:  Preoperative CBC shows a low hemoglobin of 11.9,  hematocrit 34.4.  Routine chemistries show sodium 139, potassium 4.3,  glucose 106.  Preoperative films of the foot showed the space between the  second and third toes without evidence of fracture.  Do not see a  preoperative chest or EKG in the chart.   HOSPITAL COURSE:  The patient was taken from the emergency  room directly to  the OR for removal of the pitchfork as well as irrigation and debridement by  Jene Every, M.D.  She was then transferred to the PACU and then to the  orthopedic floor for continued postoperative care.  She was placed on IV  antibiotics for 48 hours as well as pain control.  Postoperative day #1 the  patient's pain was doing fairly well.  The patient did have decreased  sensation along the second toe, but had good motor function.  Incision was  clean and dry.  PCA was discontinued.  The patient was continued on IV  antibiotics.  Postoperative day #3 the patient was doing well.  She was  ready for discharge.  Pain was well controlled on p.o. analgesics.  Dressing  was changed.  Incision was  clean and dry.  The patient does continue to have  decreased sensation along the second toe.  She does have good motor  function.  Calves soft, nontender without evidence of DVT.  It is felt at  this time patient is stable enough to be discharged home.   DISCHARGE INSTRUCTIONS:  The patient will be discharged to home.  Follow up  with Jene Every, M.D. in one week.  She is to change her dressing daily.  She should be nonweightbearing on the foot in a hard sole shoe.  Elevate the  left lower extremity.   DISCHARGE MEDICATIONS:  1. Vicodin p.r.n. pain.  2. Keflex 500 mg one p.o. q.i.d.   CONDITION ON DISCHARGE:  Stable.   FINAL DIAGNOSES:  Status post pitchfork removal as well as I&D left foot.      Roma Schanz, P.A.                   Jene Every, M.D.    CS/MEDQ  D:  08/06/2003  T:  08/06/2003  Job:  [id number]

## 2011-05-01 NOTE — H&P (Signed)
[facility]  Patient:    Alison Kim, Alison Kim                       MRN: [id number] Adm. Date:  [id number] Disc. Date: [id number] Attending:  Doug Sou                   Psychiatric Admission Assessment  NO DICTATION DD:  03/07/01 TD:  03/07/01 Job: [id number]
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
## 2011-05-01 NOTE — Op Note (Signed)
[place]. [facility]  Patient:    Alison Kim, Alison Kim                       MRN: [id number] Proc. Date: 01/24/01 Adm. Date:  01/24/01 Attending:  Lowell Bouton, M.D.                           Operative Report  PREOPERATIVE DIAGNOSIS:  Right carpal tunnel syndrome.  POSTOPERATIVE DIAGNOSIS:  Right carpal tunnel syndrome.  OPERATION PERFORMED:  Decompression median nerve right carpal tunnel.  SURGEON:  Lowell Bouton, M.D.  ANESTHESIA:  0.5% Marcaine local with sedation.  OPERATIVE FINDINGS:  The patient had a very narrow carpal canal with significant of the tenosynovium around the flexor tendons.  The motor branch of the nerve was intact and there were no masses present in the carpal canal.  DESCRIPTION OF PROCEDURE:  Under 0.5% Marcaine local anesthesia with the tourniquet on the right arm, the right hand was prepped and draped in the usual sterile fashion and after exsanguinating the limb, the tourniquet was inflated to 250 mmHg.  A 3 cm longitudinal incision was made in the palm just ulnar to the thenar crease.  Sharp dissection was carried to the subcutaneous tissues and bleeding points were coagulated.  Blunt dissection was carried through the superficial palmar fascia distal to the transverse carpal ligament.  A hemostat was placed in the carpal canal up against the hook of the hamate and the transverse carpal ligament was divided on the ulnar border of the median nerve.  The proximal end of the ligament was divided with the scissors after dissecting the nerve away from the undersurface of the ligament.  The external epineurium was then released.  The motor branch was then identified.  The carpal canal was then palpated and was found to be adequately decompressed.  The wound was irrigated with saline.  The skin was closed with 4-0 nylon sutures.  Sterile dressings were applied followed by volar   wrist splint.  The patient  tolerated the procedure well and went to the recovery room awake and stable in good condition.  DD:  01/24/01 TD:  01/24/01 Job: [id number]
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
## 2011-05-01 NOTE — Discharge Summary (Signed)
[facility]  Patient:    Alison Kim, Alison Kim                     MRN: [id number] Adm. Date:  [id number] Disc. Date: [id number] Attending:  Jasmine Pang CC:         De Nurse, M.D., [facility]   Discharge Summary  REASON FOR ADMISSION:  Patient was a 45 year old Caucasian female referred by [facility] after being stabilized status post an overdose.  She has a long history of depression and has recently been experiencing a worsening of her condition.  She was experiencing multiple neurovegetative symptoms and suicidal ideation.  On the day prior to admission, she took an overdose of 2 Trazodone, 8 Zoloft and 16 diazepam.  she cites multiple stressors including difficulty raising a rebellious teenage daughter.  She is also anxious because [employer] is reconsidering her case and stating that she should be able to work.  Patient is currently seen at the [facility].  She is treated for bipolar disorder. Her physician is De Nurse, M.D.  For further admission information see psychiatric admission assessment.  PHYSICAL EXAMINATION:  This was done by [facility].  Patient was noted to be status post bilateral carpal tunnel syndrome.  She has a history of bronchial asthma.  She is status post partial hysterectomy.  She has knee pain from a pinched sciatic nerve.  ADMISSION LABORATORIES:  Most labs were done in the ED prior to her transfer to our unit.  I did do a hepatic profile which within normal limits.  A free T3 and TSH level were within normal limits.  HOSPITAL COURSE:  Upon admission, patient was continued on her home medications.  These include Claritin 10 mg tablets q.d., Celebrex 200 mg tablets q.d., diazepam 5 mg 1 p.o. b.i.d. p.r.n. anxiety, Zoloft 100 mg q.d., Prevacid 15 mg 1 b.i.d., Trazodone 50-100 mg at h.s.  On March 07, 2001, she was started on Motrin 800 mg  q.6h. p.r.n. headache, given her complaints of severe headache.  She was also placed on an albuterol inhaler 2 puffs q.4h. p.r.n. shortness of breath.  She stated she had used this at home. DD:  04/10/01 TD:  04/11/01 Job: [id number]
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
## 2011-05-05 ENCOUNTER — Ambulatory Visit: Payer: Medicare Other | Admitting: Physical Therapy

## 2011-05-07 ENCOUNTER — Encounter: Payer: Medicare Other | Admitting: Physical Therapy

## 2011-05-13 ENCOUNTER — Encounter: Payer: Medicare Other | Admitting: Physical Therapy

## 2011-05-18 ENCOUNTER — Ambulatory Visit: Payer: Medicare Other | Attending: Neurosurgery | Admitting: Physical Therapy

## 2011-05-18 DIAGNOSIS — M2560 Stiffness of unspecified joint, not elsewhere classified: Secondary | ICD-10-CM | POA: Insufficient documentation

## 2011-05-18 DIAGNOSIS — IMO0001 Reserved for inherently not codable concepts without codable children: Secondary | ICD-10-CM | POA: Insufficient documentation

## 2011-05-18 DIAGNOSIS — M542 Cervicalgia: Secondary | ICD-10-CM | POA: Insufficient documentation

## 2011-05-18 DIAGNOSIS — R293 Abnormal posture: Secondary | ICD-10-CM | POA: Insufficient documentation

## 2011-05-20 ENCOUNTER — Ambulatory Visit: Payer: Medicare Other | Admitting: Physical Therapy

## 2011-05-25 ENCOUNTER — Encounter: Payer: Medicare Other | Admitting: Physical Therapy

## 2011-05-27 ENCOUNTER — Encounter: Payer: Medicare Other | Admitting: Physical Therapy

## 2011-06-11 ENCOUNTER — Emergency Department (HOSPITAL_COMMUNITY)
Admission: EM | Admit: 2011-06-11 | Discharge: 2011-06-11 | Disposition: A | Discharge status: Home / Self Care | Payer: Medicare Other | Attending: Emergency Medicine | Admitting: Emergency Medicine

## 2011-06-11 DIAGNOSIS — R000 Tachycardia, unspecified: Secondary | ICD-10-CM | POA: Insufficient documentation

## 2011-06-11 DIAGNOSIS — E785 Hyperlipidemia, unspecified: Secondary | ICD-10-CM | POA: Insufficient documentation

## 2011-06-11 DIAGNOSIS — E119 Type 2 diabetes mellitus without complications: Secondary | ICD-10-CM | POA: Insufficient documentation

## 2011-06-11 DIAGNOSIS — R42 Dizziness and giddiness: Secondary | ICD-10-CM | POA: Insufficient documentation

## 2011-06-11 DIAGNOSIS — R112 Nausea with vomiting, unspecified: Secondary | ICD-10-CM | POA: Insufficient documentation

## 2011-06-11 DIAGNOSIS — I10 Essential (primary) hypertension: Secondary | ICD-10-CM | POA: Insufficient documentation

## 2011-06-11 DIAGNOSIS — R197 Diarrhea, unspecified: Secondary | ICD-10-CM | POA: Insufficient documentation

## 2011-06-11 LAB — URINALYSIS, ROUTINE W REFLEX MICROSCOPIC
Glucose, UA: NEGATIVE mg/dL
Hgb urine dipstick: NEGATIVE
Specific Gravity, Urine: 1.031 — ABNORMAL HIGH (ref 1.005–1.030)
pH: 5.5 (ref 5.0–8.0)

## 2011-06-11 LAB — COMPREHENSIVE METABOLIC PANEL
AST: 17 U/L (ref 0–37)
Albumin: 4.2 g/dL (ref 3.5–5.2)
Calcium: 9.9 mg/dL (ref 8.4–10.5)
Chloride: 95 mEq/L — ABNORMAL LOW (ref 96–112)
Creatinine, Ser: 0.81 mg/dL (ref 0.50–1.10)
Total Protein: 7.5 g/dL (ref 6.0–8.3)

## 2011-06-11 LAB — DIFFERENTIAL
Eosinophils Absolute: 0.2 10*3/uL (ref 0.0–0.7)
Eosinophils Relative: 2 % (ref 0–5)
Lymphs Abs: 3 10*3/uL (ref 0.7–4.0)

## 2011-06-11 LAB — CBC
MCV: 91.7 fL (ref 78.0–100.0)
Platelets: 336 10*3/uL (ref 150–400)
RDW: 14 % (ref 11.5–15.5)
WBC: 12.9 10*3/uL — ABNORMAL HIGH (ref 4.0–10.5)

## 2011-06-11 LAB — POCT PREGNANCY, URINE: Preg Test, Ur: NEGATIVE

## 2011-08-11 ENCOUNTER — Other Ambulatory Visit: Payer: Self-pay | Admitting: Gastroenterology

## 2011-08-13 ENCOUNTER — Ambulatory Visit
Admission: RE | Admit: 2011-08-13 | Discharge: 2011-08-13 | Disposition: A | Discharge status: Home / Self Care | Payer: Medicare Other | Source: Ambulatory Visit | Attending: Gastroenterology | Admitting: Gastroenterology

## 2011-09-10 LAB — BASIC METABOLIC PANEL
CO2: 23
Chloride: 100
GFR calc Af Amer: >60
Sodium: 133 — ABNORMAL LOW

## 2011-09-10 LAB — HEMOGLOBIN: Hemoglobin: 13.7

## 2011-09-14 LAB — GLUCOSE, CAPILLARY: Glucose-Capillary: 125 — ABNORMAL HIGH

## 2011-09-24 LAB — I-STAT 8, (EC8 V) (CONVERTED LAB)
BUN: 13
Bicarbonate: 24.4 — ABNORMAL HIGH
Glucose, Bld: 104 — ABNORMAL HIGH
Hemoglobin: 14.6
Operator id: 192351
Sodium: 137
TCO2: 26
pCO2, Ven: 38.2 — ABNORMAL LOW

## 2011-09-24 LAB — CBC
Platelets: 426 — ABNORMAL HIGH
RBC: 4.17
WBC: 13.4 — ABNORMAL HIGH

## 2011-09-24 LAB — DIFFERENTIAL
Lymphs Abs: 4.4 — ABNORMAL HIGH
Monocytes Relative: 6
Neutro Abs: 7.8 — ABNORMAL HIGH
Neutrophils Relative %: 59

## 2011-09-24 LAB — POCT CARDIAC MARKERS
CKMB, poc: <1 — ABNORMAL LOW
Myoglobin, poc: 51.9
Myoglobin, poc: 72.4
Operator id: 192351
Operator id: 192351
Operator id: 192351
Troponin i, poc: <0.05
Troponin i, poc: <0.05

## 2011-09-24 LAB — D-DIMER, QUANTITATIVE: D-Dimer, Quant: 0.26

## 2011-12-31 ENCOUNTER — Other Ambulatory Visit: Payer: Self-pay | Admitting: Neurosurgery

## 2011-12-31 DIAGNOSIS — M79601 Pain in right arm: Secondary | ICD-10-CM

## 2011-12-31 DIAGNOSIS — M542 Cervicalgia: Secondary | ICD-10-CM

## 2012-01-01 ENCOUNTER — Inpatient Hospital Stay: Admission: RE | Admit: 2012-01-01 | Payer: Medicare Other | Source: Ambulatory Visit

## 2012-01-07 ENCOUNTER — Ambulatory Visit
Admission: RE | Admit: 2012-01-07 | Discharge: 2012-01-07 | Disposition: A | Discharge status: Home / Self Care | Payer: Medicare Other | Source: Ambulatory Visit | Attending: Neurosurgery | Admitting: Neurosurgery

## 2012-01-07 DIAGNOSIS — M542 Cervicalgia: Secondary | ICD-10-CM

## 2012-01-07 DIAGNOSIS — M79601 Pain in right arm: Secondary | ICD-10-CM

## 2012-03-07 ENCOUNTER — Encounter (HOSPITAL_COMMUNITY): Payer: Self-pay | Admitting: Emergency Medicine

## 2012-03-07 ENCOUNTER — Emergency Department (HOSPITAL_COMMUNITY)
Admission: EM | Admit: 2012-03-07 | Discharge: 2012-03-07 | Disposition: A | Discharge status: Home / Self Care | Payer: Medicare Other | Attending: Emergency Medicine | Admitting: Emergency Medicine

## 2012-03-07 DIAGNOSIS — Z79899 Other long term (current) drug therapy: Secondary | ICD-10-CM | POA: Insufficient documentation

## 2012-03-07 DIAGNOSIS — E119 Type 2 diabetes mellitus without complications: Secondary | ICD-10-CM | POA: Insufficient documentation

## 2012-03-07 DIAGNOSIS — R110 Nausea: Secondary | ICD-10-CM | POA: Insufficient documentation

## 2012-03-07 HISTORY — DX: Diaphragmatic hernia without obstruction or gangrene: K44.9

## 2012-03-07 HISTORY — DX: Hyperlipidemia, unspecified: E78.5

## 2012-03-07 HISTORY — DX: Accidental discharge from unspecified firearms or gun, initial encounter: W34.00XA

## 2012-03-07 HISTORY — DX: Gastro-esophageal reflux disease without esophagitis: K21.9

## 2012-03-07 HISTORY — DX: Interstitial cystitis (chronic) without hematuria: N30.10

## 2012-03-07 LAB — URINALYSIS, ROUTINE W REFLEX MICROSCOPIC
Bilirubin Urine: NEGATIVE
Glucose, UA: NEGATIVE mg/dL
Hgb urine dipstick: NEGATIVE
Specific Gravity, Urine: 1.004 — ABNORMAL LOW (ref 1.005–1.030)

## 2012-03-07 LAB — POCT I-STAT, CHEM 8
BUN: 12 mg/dL (ref 6–23)
Chloride: 102 mEq/L (ref 96–112)
Creatinine, Ser: 0.7 mg/dL (ref 0.50–1.10)
Glucose, Bld: 116 mg/dL — ABNORMAL HIGH (ref 70–99)
Hemoglobin: 14.6 g/dL (ref 12.0–15.0)
Potassium: 4.1 mEq/L (ref 3.5–5.1)
Sodium: 139 mEq/L (ref 135–145)

## 2012-03-07 MED ORDER — PROMETHAZINE HCL 25 MG PO TABS: 25.0000 mg | ORAL_TABLET | Freq: Four times a day (QID) | ORAL | Status: DC | PRN

## 2012-03-07 NOTE — Discharge Instructions (Signed)
Nausea, Adult Nausea is the feeling that you have an upset stomach or have to vomit. Nausea by itself is not likely a serious concern, but it may be an early sign of more serious medical problems. As nausea gets worse, it can lead to vomiting. If vomiting develops, there is the risk of dehydration.  CAUSES   Viral infections.   Food poisoning.   Medicines.   Pregnancy.   Motion sickness.   Migraine headaches.   Emotional distress.   Severe pain from any source.   Alcohol intoxication.  HOME CARE INSTRUCTIONS  Get plenty of rest.   Ask your caregiver about specific rehydration instructions.   Eat small amounts of food and sip liquids more often.   Take all medicines as told by your caregiver.  SEEK MEDICAL CARE IF:  You have not improved after 2 days, or you get worse.   You have a headache.  SEEK IMMEDIATE MEDICAL CARE IF:   You have a fever.   You faint.   You keep vomiting or have blood in your vomit.   You are extremely weak or dehydrated.   You have dark or bloody stools.   You have severe chest or abdominal pain.  MAKE SURE YOU:  Understand these instructions.   Will watch your condition.   Will get help right away if you are not doing well or get worse.  Document Released: 01/07/2005 Document Revised: 11/19/2011 Document Reviewed: 08/12/2011 Exit[employer] Patient Information 2012 [employer]. 
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
## 2012-03-07 NOTE — ED Notes (Signed)
Pt states he blood sugar has been elevated since yesterday, states it was 517 yesterday and 300 this afternoon. Pt states today, she developed nausea, denies vomiting, abd pain or any other s/sx. CBG 140 at ED.

## 2012-08-02 ENCOUNTER — Other Ambulatory Visit: Payer: Self-pay | Admitting: Internal Medicine

## 2012-08-02 DIAGNOSIS — Z1231 Encounter for screening mammogram for malignant neoplasm of breast: Secondary | ICD-10-CM

## 2012-08-17 ENCOUNTER — Ambulatory Visit: Payer: Medicare Other

## 2012-10-08 ENCOUNTER — Encounter (HOSPITAL_COMMUNITY): Payer: Self-pay | Admitting: Emergency Medicine

## 2012-10-08 ENCOUNTER — Emergency Department (HOSPITAL_COMMUNITY): Payer: Medicare Other

## 2012-10-08 ENCOUNTER — Emergency Department (HOSPITAL_COMMUNITY)
Admission: EM | Admit: 2012-10-08 | Discharge: 2012-10-08 | Disposition: A | Discharge status: Home / Self Care | Payer: Medicare Other | Attending: Emergency Medicine | Admitting: Emergency Medicine

## 2012-10-08 DIAGNOSIS — S92309A Fracture of unspecified metatarsal bone(s), unspecified foot, initial encounter for closed fracture: Secondary | ICD-10-CM | POA: Insufficient documentation

## 2012-10-08 DIAGNOSIS — E119 Type 2 diabetes mellitus without complications: Secondary | ICD-10-CM | POA: Insufficient documentation

## 2012-10-08 DIAGNOSIS — Y9339 Activity, other involving climbing, rappelling and jumping off: Secondary | ICD-10-CM | POA: Insufficient documentation

## 2012-10-08 DIAGNOSIS — E785 Hyperlipidemia, unspecified: Secondary | ICD-10-CM | POA: Insufficient documentation

## 2012-10-08 DIAGNOSIS — T148XXA Other injury of unspecified body region, initial encounter: Secondary | ICD-10-CM

## 2012-10-08 DIAGNOSIS — W11XXXA Fall on and from ladder, initial encounter: Secondary | ICD-10-CM | POA: Insufficient documentation

## 2012-10-08 DIAGNOSIS — K219 Gastro-esophageal reflux disease without esophagitis: Secondary | ICD-10-CM | POA: Insufficient documentation

## 2012-10-08 DIAGNOSIS — Z8719 Personal history of other diseases of the digestive system: Secondary | ICD-10-CM | POA: Insufficient documentation

## 2012-10-08 DIAGNOSIS — Z79899 Other long term (current) drug therapy: Secondary | ICD-10-CM | POA: Insufficient documentation

## 2012-10-08 DIAGNOSIS — W19XXXA Unspecified fall, initial encounter: Secondary | ICD-10-CM

## 2012-10-08 DIAGNOSIS — Z87828 Personal history of other (healed) physical injury and trauma: Secondary | ICD-10-CM | POA: Insufficient documentation

## 2012-10-08 DIAGNOSIS — F17200 Nicotine dependence, unspecified, uncomplicated: Secondary | ICD-10-CM | POA: Insufficient documentation

## 2012-10-08 DIAGNOSIS — Y9289 Other specified places as the place of occurrence of the external cause: Secondary | ICD-10-CM | POA: Insufficient documentation

## 2012-10-08 DIAGNOSIS — S8010XA Contusion of unspecified lower leg, initial encounter: Secondary | ICD-10-CM | POA: Insufficient documentation

## 2012-10-08 DIAGNOSIS — M79609 Pain in unspecified limb: Secondary | ICD-10-CM | POA: Insufficient documentation

## 2012-10-08 LAB — GLUCOSE, CAPILLARY: Glucose-Capillary: 74 mg/dL (ref 70–99)

## 2012-10-08 MED ORDER — KETOROLAC TROMETHAMINE 30 MG/ML IJ SOLN
30.0000 mg | Freq: Once | INTRAMUSCULAR | Status: AC
  Administered 2012-10-08: 30 mg via INTRAVENOUS
  Filled 2012-10-08: qty 1

## 2012-10-08 MED ORDER — IBUPROFEN 600 MG PO TABS: 600.0000 mg | ORAL_TABLET | Freq: Four times a day (QID) | ORAL | Status: DC | PRN

## 2012-10-08 MED ORDER — HYDROMORPHONE HCL PF 1 MG/ML IJ SOLN
0.5000 mg | Freq: Once | INTRAMUSCULAR | Status: AC
  Administered 2012-10-08: 0.5 mg via INTRAVENOUS
  Filled 2012-10-08: qty 1

## 2012-10-08 MED ORDER — DIPHENHYDRAMINE HCL 25 MG PO CAPS: 25.0000 mg | ORAL_CAPSULE | Freq: Four times a day (QID) | ORAL | Status: DC | PRN

## 2012-10-08 MED ORDER — HYDROCODONE-ACETAMINOPHEN 5-325 MG PO TABS: 1.0000 | ORAL_TABLET | ORAL | Status: DC | PRN

## 2012-10-08 MED ORDER — HYDROMORPHONE HCL PF 1 MG/ML IJ SOLN
INTRAMUSCULAR | Status: AC
  Administered 2012-10-08: 1 mg
  Filled 2012-10-08: qty 1

## 2012-10-08 NOTE — ED Notes (Signed)
ZOX:WRUE<AV> Expected date:<BR> Expected time:<BR> Means of arrival:<BR> Comments:<BR> Fell from [employer]
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
## 2012-10-08 NOTE — ED Notes (Signed)
Per pt bshe was going down attic stairs carrying a box, was 3-4 steps from bottom and fell. Pt reports she had to crawl to phone as she couldn't bear weight.

## 2013-01-30 ENCOUNTER — Other Ambulatory Visit: Payer: Self-pay | Admitting: Neurosurgery

## 2013-01-30 DIAGNOSIS — M5000 Cervical disc disorder with myelopathy, unspecified cervical region: Secondary | ICD-10-CM

## 2013-02-06 ENCOUNTER — Ambulatory Visit
Admission: RE | Admit: 2013-02-06 | Discharge: 2013-02-06 | Disposition: A | Discharge status: Home / Self Care | Payer: Medicare Other | Source: Ambulatory Visit | Attending: Neurosurgery | Admitting: Neurosurgery

## 2013-02-06 DIAGNOSIS — M5000 Cervical disc disorder with myelopathy, unspecified cervical region: Secondary | ICD-10-CM

## 2013-02-06 MED ORDER — GADOBENATE DIMEGLUMINE 529 MG/ML IV SOLN
19.0000 mL | Freq: Once | INTRAVENOUS | Status: AC | PRN
  Administered 2013-02-06: 19 mL via INTRAVENOUS

## 2013-07-29 ENCOUNTER — Emergency Department (HOSPITAL_COMMUNITY)
Admission: EM | Admit: 2013-07-29 | Discharge: 2013-07-29 | Disposition: A | Discharge status: Home / Self Care | Payer: Medicare Other | Attending: Emergency Medicine | Admitting: Emergency Medicine

## 2013-07-29 ENCOUNTER — Emergency Department (HOSPITAL_COMMUNITY): Payer: Medicare Other

## 2013-07-29 ENCOUNTER — Encounter (HOSPITAL_COMMUNITY): Payer: Self-pay | Admitting: Emergency Medicine

## 2013-07-29 DIAGNOSIS — E119 Type 2 diabetes mellitus without complications: Secondary | ICD-10-CM | POA: Insufficient documentation

## 2013-07-29 DIAGNOSIS — S40012A Contusion of left shoulder, initial encounter: Secondary | ICD-10-CM

## 2013-07-29 DIAGNOSIS — Z880 Allergy status to penicillin: Secondary | ICD-10-CM | POA: Insufficient documentation

## 2013-07-29 DIAGNOSIS — S40019A Contusion of unspecified shoulder, initial encounter: Secondary | ICD-10-CM | POA: Insufficient documentation

## 2013-07-29 DIAGNOSIS — Z87448 Personal history of other diseases of urinary system: Secondary | ICD-10-CM | POA: Insufficient documentation

## 2013-07-29 DIAGNOSIS — W1809XA Striking against other object with subsequent fall, initial encounter: Secondary | ICD-10-CM | POA: Insufficient documentation

## 2013-07-29 DIAGNOSIS — Y9389 Activity, other specified: Secondary | ICD-10-CM | POA: Insufficient documentation

## 2013-07-29 DIAGNOSIS — Z87828 Personal history of other (healed) physical injury and trauma: Secondary | ICD-10-CM | POA: Insufficient documentation

## 2013-07-29 DIAGNOSIS — IMO0002 Reserved for concepts with insufficient information to code with codable children: Secondary | ICD-10-CM | POA: Insufficient documentation

## 2013-07-29 DIAGNOSIS — Z8719 Personal history of other diseases of the digestive system: Secondary | ICD-10-CM | POA: Insufficient documentation

## 2013-07-29 DIAGNOSIS — F17200 Nicotine dependence, unspecified, uncomplicated: Secondary | ICD-10-CM | POA: Insufficient documentation

## 2013-07-29 DIAGNOSIS — E785 Hyperlipidemia, unspecified: Secondary | ICD-10-CM | POA: Insufficient documentation

## 2013-07-29 DIAGNOSIS — Z79899 Other long term (current) drug therapy: Secondary | ICD-10-CM | POA: Insufficient documentation

## 2013-07-29 DIAGNOSIS — W010XXA Fall on same level from slipping, tripping and stumbling without subsequent striking against object, initial encounter: Secondary | ICD-10-CM | POA: Insufficient documentation

## 2013-07-29 DIAGNOSIS — Y92009 Unspecified place in unspecified non-institutional (private) residence as the place of occurrence of the external cause: Secondary | ICD-10-CM | POA: Insufficient documentation

## 2013-07-29 MED ORDER — OXYCODONE-ACETAMINOPHEN 5-325 MG PO TABS: 1.0000 | ORAL_TABLET | ORAL | Status: DC | PRN

## 2013-07-29 MED ORDER — CYCLOBENZAPRINE HCL 10 MG PO TABS: 10.0000 mg | ORAL_TABLET | Freq: Two times a day (BID) | ORAL | Status: DC | PRN

## 2013-07-29 MED ORDER — HYDROMORPHONE HCL PF 1 MG/ML IJ SOLN
1.0000 mg | Freq: Once | INTRAMUSCULAR | Status: AC
  Administered 2013-07-29: 1 mg via INTRAVENOUS
  Filled 2013-07-29: qty 1

## 2013-07-29 NOTE — ED Provider Notes (Signed)
Medical screening examination/treatment/procedure(s) were performed by non-physician practitioner and as supervising physician I was immediately available for consultation/collaboration.  Ethelda Chick, MD 07/29/13 [phone]
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
## 2013-07-29 NOTE — ED Notes (Signed)
Pt requesting something to drink, informed that was not possible since we aren't sure if she'll have to go to OR.  Pt ambulated to bathroom.

## 2013-07-29 NOTE — ED Notes (Signed)
Received report from [facility], [employer] fundraiser.  Pt resting on stretcher, 10/10 pain in left shoulder.  Receiving Dilaudid for pain.  ED Reg had not yet registered pt - just completed that and xray informed.

## 2013-07-29 NOTE — ED Notes (Signed)
Pt states she was getting out of bed this morning and slipped, hit her left shoulder. Pt states shoulder is dislocated and is unable to move extremity.

## 2013-08-08 ENCOUNTER — Emergency Department (HOSPITAL_COMMUNITY)
Admission: EM | Admit: 2013-08-08 | Discharge: 2013-08-08 | Disposition: A | Discharge status: Home / Self Care | Payer: Medicare Other | Attending: Emergency Medicine | Admitting: Emergency Medicine

## 2013-08-08 ENCOUNTER — Encounter (HOSPITAL_COMMUNITY): Payer: Self-pay

## 2013-08-08 DIAGNOSIS — Y9389 Activity, other specified: Secondary | ICD-10-CM | POA: Insufficient documentation

## 2013-08-08 DIAGNOSIS — Z79899 Other long term (current) drug therapy: Secondary | ICD-10-CM | POA: Insufficient documentation

## 2013-08-08 DIAGNOSIS — E119 Type 2 diabetes mellitus without complications: Secondary | ICD-10-CM | POA: Insufficient documentation

## 2013-08-08 DIAGNOSIS — Y929 Unspecified place or not applicable: Secondary | ICD-10-CM | POA: Insufficient documentation

## 2013-08-08 DIAGNOSIS — R296 Repeated falls: Secondary | ICD-10-CM | POA: Insufficient documentation

## 2013-08-08 DIAGNOSIS — E785 Hyperlipidemia, unspecified: Secondary | ICD-10-CM | POA: Insufficient documentation

## 2013-08-08 DIAGNOSIS — Z87448 Personal history of other diseases of urinary system: Secondary | ICD-10-CM | POA: Insufficient documentation

## 2013-08-08 DIAGNOSIS — Z87828 Personal history of other (healed) physical injury and trauma: Secondary | ICD-10-CM | POA: Insufficient documentation

## 2013-08-08 DIAGNOSIS — S43005A Unspecified dislocation of left shoulder joint, initial encounter: Secondary | ICD-10-CM

## 2013-08-08 DIAGNOSIS — S43109A Unspecified dislocation of unspecified acromioclavicular joint, initial encounter: Secondary | ICD-10-CM | POA: Insufficient documentation

## 2013-08-08 DIAGNOSIS — F17200 Nicotine dependence, unspecified, uncomplicated: Secondary | ICD-10-CM | POA: Insufficient documentation

## 2013-08-08 DIAGNOSIS — Z8719 Personal history of other diseases of the digestive system: Secondary | ICD-10-CM | POA: Insufficient documentation

## 2013-08-08 MED ORDER — OXYCODONE-ACETAMINOPHEN 5-325 MG PO TABS
2.0000 | ORAL_TABLET | Freq: Once | ORAL | Status: AC
  Administered 2013-08-08: 2 via ORAL
  Filled 2013-08-08: qty 2

## 2013-08-08 MED ORDER — OXYCODONE-ACETAMINOPHEN 5-325 MG PO TABS: 1.0000 | ORAL_TABLET | ORAL | Status: DC | PRN

## 2013-08-08 MED ORDER — CYCLOBENZAPRINE HCL 10 MG PO TABS: 10.0000 mg | ORAL_TABLET | Freq: Three times a day (TID) | ORAL | Status: DC | PRN

## 2013-08-08 NOTE — ED Notes (Signed)
Pt has a ride home.  

## 2013-08-08 NOTE — ED Notes (Signed)
Pt fell 10 days ago seen and treated here for evaluation of left shoulder pain DX with contusion.  Discharged with referral to sports medicine. Given RX and sling.  Pt states has appt with PCP this Friday. Pt states no improvement.
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
## 2013-08-09 NOTE — ED Provider Notes (Addendum)
Medical screening examination/treatment/procedure(s) were performed by non-physician practitioner and as supervising physician I was immediately available for consultation/collaboration.  [name], MD 08/09/13 2952  [name], MD 08/16/13 [phone]

## 2013-08-25 ENCOUNTER — Other Ambulatory Visit: Payer: Self-pay | Admitting: Neurosurgery

## 2013-08-25 ENCOUNTER — Ambulatory Visit
Admission: RE | Admit: 2013-08-25 | Discharge: 2013-08-25 | Disposition: A | Discharge status: Home / Self Care | Payer: Medicare Other | Source: Ambulatory Visit | Attending: Neurosurgery | Admitting: Neurosurgery

## 2013-08-25 DIAGNOSIS — M542 Cervicalgia: Secondary | ICD-10-CM

## 2013-08-25 DIAGNOSIS — M5030 Other cervical disc degeneration, unspecified cervical region: Secondary | ICD-10-CM

## 2013-08-25 DIAGNOSIS — Z9181 History of falling: Secondary | ICD-10-CM

## 2013-11-29 ENCOUNTER — Other Ambulatory Visit: Payer: Self-pay | Admitting: Orthopedic Surgery

## 2013-11-29 ENCOUNTER — Encounter (HOSPITAL_COMMUNITY): Payer: Self-pay | Admitting: Pharmacy Technician

## 2013-12-05 ENCOUNTER — Encounter (HOSPITAL_COMMUNITY): Payer: Self-pay

## 2013-12-05 ENCOUNTER — Encounter (HOSPITAL_COMMUNITY)
Admission: RE | Admit: 2013-12-05 | Discharge: 2013-12-05 | Disposition: A | Discharge status: Home / Self Care | Payer: Medicare Other | Source: Ambulatory Visit | Attending: Orthopedic Surgery | Admitting: Orthopedic Surgery

## 2013-12-05 DIAGNOSIS — Z01818 Encounter for other preprocedural examination: Secondary | ICD-10-CM | POA: Insufficient documentation

## 2013-12-05 DIAGNOSIS — Z01810 Encounter for preprocedural cardiovascular examination: Secondary | ICD-10-CM | POA: Insufficient documentation

## 2013-12-05 DIAGNOSIS — Z01812 Encounter for preprocedural laboratory examination: Secondary | ICD-10-CM | POA: Insufficient documentation

## 2013-12-05 HISTORY — DX: Chronic obstructive pulmonary disease, unspecified: J44.9

## 2013-12-05 HISTORY — DX: Major depressive disorder, single episode, unspecified: F32.9

## 2013-12-05 HISTORY — DX: Depression, unspecified: F32.A

## 2013-12-05 HISTORY — DX: Anxiety disorder, unspecified: F41.9

## 2013-12-05 HISTORY — DX: Essential (primary) hypertension: I10

## 2013-12-05 LAB — BASIC METABOLIC PANEL
BUN: 13 mg/dL (ref 6–23)
CO2: 27 mEq/L (ref 19–32)
Calcium: 9 mg/dL (ref 8.4–10.5)
Creatinine, Ser: 0.74 mg/dL (ref 0.50–1.10)

## 2013-12-05 LAB — CBC
MCH: 30.7 pg (ref 26.0–34.0)
MCV: 93.4 fL (ref 78.0–100.0)
Platelets: 303 10*3/uL (ref 150–400)
RBC: 4.11 MIL/uL (ref 3.87–5.11)
RDW: 13.9 % (ref 11.5–15.5)

## 2013-12-05 NOTE — Pre-Procedure Instructions (Signed)
Alison Kim  12/05/2013   Your procedure is scheduled on:  12/12/13  Report to [facility]  2 * 3 at 845 AM.  Call this number if you have problems the morning of surgery: [phone]   Remember:   Do not eat food or drink liquids after midnight.   Take these medicines the morning of surgery with A SIP OF WATER: all inhalers,xanax,prozac,neurontin,wellburtrin   Do not wear jewelry, make-up or nail polish.  Do not wear lotions, powders, or perfumes. You may wear deodorant.  Do not shave 48 hours prior to surgery. Men may shave face and neck.  Do not bring valuables to the hospital.  [facility] is not responsible                  for any belongings or valuables.               Contacts, dentures or bridgework may not be worn into surgery.  Leave suitcase in the car. After surgery it may be brought to your room.  For patients admitted to the hospital, discharge time is determined by your                treatment team.               Patients discharged the day of surgery will not be allowed to drive  home.  Name and phone number of your driver: family  Special Instructions: Incentive Spirometry - Practice and bring it with you on the day of surgery.   Please read over the following fact sheets that you were given: Pain Booklet, Coughing and Deep Breathing and Surgical Site Infection Prevention

## 2013-12-11 MED ORDER — CLINDAMYCIN PHOSPHATE 900 MG/50ML IV SOLN
900.0000 mg | INTRAVENOUS | Status: AC
  Administered 2013-12-12: 900 mg via INTRAVENOUS
  Filled 2013-12-11: qty 50

## 2013-12-12 ENCOUNTER — Ambulatory Visit (HOSPITAL_COMMUNITY)
Admission: RE | Admit: 2013-12-12 | Discharge: 2013-12-13 | Disposition: A | Discharge status: Home / Self Care | Payer: Medicare Other | Source: Ambulatory Visit | Attending: Orthopedic Surgery | Admitting: Orthopedic Surgery

## 2013-12-12 ENCOUNTER — Ambulatory Visit (HOSPITAL_COMMUNITY): Payer: Medicare Other | Admitting: Anesthesiology

## 2013-12-12 ENCOUNTER — Encounter (HOSPITAL_COMMUNITY): Payer: Medicare Other | Admitting: Anesthesiology

## 2013-12-12 ENCOUNTER — Encounter (HOSPITAL_COMMUNITY): Payer: Self-pay | Admitting: *Deleted

## 2013-12-12 ENCOUNTER — Ambulatory Visit (HOSPITAL_COMMUNITY): Payer: Medicare Other

## 2013-12-12 ENCOUNTER — Encounter (HOSPITAL_COMMUNITY)
Admission: RE | Disposition: A | Discharge status: Home / Self Care | Payer: Self-pay | Source: Ambulatory Visit | Attending: Orthopedic Surgery

## 2013-12-12 DIAGNOSIS — I10 Essential (primary) hypertension: Secondary | ICD-10-CM | POA: Insufficient documentation

## 2013-12-12 DIAGNOSIS — E119 Type 2 diabetes mellitus without complications: Secondary | ICD-10-CM | POA: Diagnosis not present

## 2013-12-12 DIAGNOSIS — M1610 Unilateral primary osteoarthritis, unspecified hip: Secondary | ICD-10-CM | POA: Insufficient documentation

## 2013-12-12 DIAGNOSIS — J449 Chronic obstructive pulmonary disease, unspecified: Secondary | ICD-10-CM | POA: Diagnosis not present

## 2013-12-12 DIAGNOSIS — M549 Dorsalgia, unspecified: Secondary | ICD-10-CM | POA: Insufficient documentation

## 2013-12-12 DIAGNOSIS — IMO0002 Reserved for concepts with insufficient information to code with codable children: Secondary | ICD-10-CM | POA: Diagnosis not present

## 2013-12-12 DIAGNOSIS — K219 Gastro-esophageal reflux disease without esophagitis: Secondary | ICD-10-CM | POA: Insufficient documentation

## 2013-12-12 DIAGNOSIS — M19019 Primary osteoarthritis, unspecified shoulder: Secondary | ICD-10-CM | POA: Diagnosis not present

## 2013-12-12 DIAGNOSIS — E785 Hyperlipidemia, unspecified: Secondary | ICD-10-CM | POA: Insufficient documentation

## 2013-12-12 DIAGNOSIS — G8929 Other chronic pain: Secondary | ICD-10-CM | POA: Diagnosis not present

## 2013-12-12 DIAGNOSIS — K449 Diaphragmatic hernia without obstruction or gangrene: Secondary | ICD-10-CM | POA: Insufficient documentation

## 2013-12-12 DIAGNOSIS — M24419 Recurrent dislocation, unspecified shoulder: Secondary | ICD-10-CM | POA: Insufficient documentation

## 2013-12-12 DIAGNOSIS — S4992XD Unspecified injury of left shoulder and upper arm, subsequent encounter: Secondary | ICD-10-CM

## 2013-12-12 DIAGNOSIS — Z9181 History of falling: Secondary | ICD-10-CM | POA: Diagnosis not present

## 2013-12-12 DIAGNOSIS — F4310 Post-traumatic stress disorder, unspecified: Secondary | ICD-10-CM | POA: Diagnosis not present

## 2013-12-12 DIAGNOSIS — F17200 Nicotine dependence, unspecified, uncomplicated: Secondary | ICD-10-CM | POA: Diagnosis not present

## 2013-12-12 DIAGNOSIS — N3010 Interstitial cystitis (chronic) without hematuria: Secondary | ICD-10-CM | POA: Insufficient documentation

## 2013-12-12 DIAGNOSIS — J4489 Other specified chronic obstructive pulmonary disease: Secondary | ICD-10-CM | POA: Insufficient documentation

## 2013-12-12 DIAGNOSIS — S43109A Unspecified dislocation of unspecified acromioclavicular joint, initial encounter: Secondary | ICD-10-CM

## 2013-12-12 HISTORY — DX: Pneumonia, unspecified organism: J18.9

## 2013-12-12 HISTORY — DX: Unspecified osteoarthritis, unspecified site: M19.90

## 2013-12-12 HISTORY — DX: Migraine, unspecified, not intractable, without status migrainosus: G43.909

## 2013-12-12 HISTORY — DX: Reserved for concepts with insufficient information to code with codable children: IMO0002

## 2013-12-12 HISTORY — DX: Other chronic pain: G89.29

## 2013-12-12 HISTORY — PX: RECONSTRUCTION OF CORACOCLAVICULAR LIGAMENT: SHX6045

## 2013-12-12 HISTORY — DX: Post-traumatic stress disorder, unspecified: F43.10

## 2013-12-12 HISTORY — DX: Shortness of breath: R06.02

## 2013-12-12 HISTORY — DX: Type 2 diabetes mellitus without complications: E11.9

## 2013-12-12 HISTORY — PX: CORACOCLAVICULAR LIGAMENT RECONSTRUCTION: SHX1397

## 2013-12-12 HISTORY — DX: Repeated falls: R29.6

## 2013-12-12 HISTORY — DX: Dorsalgia, unspecified: M54.9

## 2013-12-12 LAB — GLUCOSE, CAPILLARY
Glucose-Capillary: 160 mg/dL — ABNORMAL HIGH (ref 70–99)
Glucose-Capillary: 187 mg/dL — ABNORMAL HIGH (ref 70–99)

## 2013-12-12 SURGERY — RECONSTRUCTION, LIGAMENT, CORACOCLAVICULAR
Anesthesia: General | Laterality: Left

## 2013-12-12 MED ORDER — OXYCODONE-ACETAMINOPHEN 5-325 MG PO TABS: 1.0000 | ORAL_TABLET | ORAL | Status: DC | PRN

## 2013-12-12 MED ORDER — DIPHENHYDRAMINE HCL 12.5 MG/5ML PO ELIX: 12.5000 mg | ORAL_SOLUTION | ORAL | Status: DC | PRN

## 2013-12-12 MED ORDER — PHENYLEPHRINE HCL 10 MG/ML IJ SOLN
INTRAMUSCULAR | Status: DC | PRN
  Administered 2013-12-12: 80 ug via INTRAVENOUS
  Administered 2013-12-12 (×2): 120 ug via INTRAVENOUS
  Administered 2013-12-12: 80 ug via INTRAVENOUS

## 2013-12-12 MED ORDER — HYDROXYZINE HCL 10 MG PO TABS
10.0000 mg | ORAL_TABLET | Freq: Three times a day (TID) | ORAL | Status: DC | PRN
  Filled 2013-12-12: qty 1

## 2013-12-12 MED ORDER — METOCLOPRAMIDE HCL 5 MG PO TABS
5.0000 mg | ORAL_TABLET | Freq: Three times a day (TID) | ORAL | Status: DC | PRN
  Filled 2013-12-12: qty 2

## 2013-12-12 MED ORDER — HYDROCODONE-ACETAMINOPHEN 5-325 MG PO TABS
ORAL_TABLET | ORAL | Status: AC
  Filled 2013-12-12: qty 2

## 2013-12-12 MED ORDER — METOCLOPRAMIDE HCL 5 MG/ML IJ SOLN: 5.0000 mg | Freq: Three times a day (TID) | INTRAMUSCULAR | Status: DC | PRN

## 2013-12-12 MED ORDER — BISACODYL 10 MG RE SUPP: 10.0000 mg | Freq: Every day | RECTAL | Status: DC | PRN

## 2013-12-12 MED ORDER — DIPHENHYDRAMINE HCL 50 MG/ML IJ SOLN
INTRAMUSCULAR | Status: AC
  Filled 2013-12-12: qty 1

## 2013-12-12 MED ORDER — ALBUTEROL SULFATE HFA 108 (90 BASE) MCG/ACT IN AERS
2.0000 | INHALATION_SPRAY | Freq: Four times a day (QID) | RESPIRATORY_TRACT | Status: DC | PRN
  Filled 2013-12-12: qty 6.7

## 2013-12-12 MED ORDER — ACETAMINOPHEN 650 MG RE SUPP: 650.0000 mg | Freq: Four times a day (QID) | RECTAL | Status: DC | PRN

## 2013-12-12 MED ORDER — SODIUM CHLORIDE 0.9 % IV SOLN
INTRAVENOUS | Status: DC
  Administered 2013-12-12 – 2013-12-13 (×2): via INTRAVENOUS

## 2013-12-12 MED ORDER — OXYCODONE HCL 5 MG PO TABS
ORAL_TABLET | ORAL | Status: AC
  Filled 2013-12-12: qty 1

## 2013-12-12 MED ORDER — OXYCODONE HCL 5 MG PO TABS
5.0000 mg | ORAL_TABLET | ORAL | Status: DC | PRN
  Administered 2013-12-12 – 2013-12-13 (×4): 10 mg via ORAL
  Filled 2013-12-12 (×4): qty 2

## 2013-12-12 MED ORDER — OXYCODONE HCL 5 MG/5ML PO SOLN: 5.0000 mg | Freq: Once | ORAL | Status: AC | PRN

## 2013-12-12 MED ORDER — HYDROCODONE-ACETAMINOPHEN 5-325 MG PO TABS
1.0000 | ORAL_TABLET | ORAL | Status: DC | PRN
  Administered 2013-12-12 – 2013-12-13 (×2): 2 via ORAL
  Filled 2013-12-12: qty 2

## 2013-12-12 MED ORDER — POVIDONE-IODINE 7.5 % EX SOLN
Freq: Once | CUTANEOUS | Status: DC
  Filled 2013-12-12: qty 118

## 2013-12-12 MED ORDER — GABAPENTIN 300 MG PO CAPS
900.0000 mg | ORAL_CAPSULE | Freq: Three times a day (TID) | ORAL | Status: DC
  Administered 2013-12-12: 900 mg via ORAL
  Filled 2013-12-12 (×4): qty 3

## 2013-12-12 MED ORDER — ATORVASTATIN CALCIUM 40 MG PO TABS
40.0000 mg | ORAL_TABLET | Freq: Every day | ORAL | Status: DC
  Administered 2013-12-12: 40 mg via ORAL
  Filled 2013-12-12 (×2): qty 1

## 2013-12-12 MED ORDER — BUPIVACAINE-EPINEPHRINE (PF) 0.5% -1:200000 IJ SOLN
INTRAMUSCULAR | Status: AC
  Filled 2013-12-12: qty 10

## 2013-12-12 MED ORDER — MORPHINE SULFATE 2 MG/ML IJ SOLN
1.0000 mg | INTRAMUSCULAR | Status: DC | PRN
  Administered 2013-12-12 (×2): 1 mg via INTRAVENOUS
  Filled 2013-12-12 (×2): qty 1

## 2013-12-12 MED ORDER — PENTOSAN POLYSULFATE SODIUM 100 MG PO CAPS
200.0000 mg | ORAL_CAPSULE | Freq: Two times a day (BID) | ORAL | Status: DC
  Administered 2013-12-12: 200 mg via ORAL
  Filled 2013-12-12 (×4): qty 2

## 2013-12-12 MED ORDER — BUPIVACAINE-EPINEPHRINE PF 0.5-1:200000 % IJ SOLN
INTRAMUSCULAR | Status: DC | PRN
  Administered 2013-12-12: 10 mL

## 2013-12-12 MED ORDER — ONDANSETRON HCL 4 MG/2ML IJ SOLN
INTRAMUSCULAR | Status: DC | PRN
  Administered 2013-12-12: 4 mg via INTRAVENOUS

## 2013-12-12 MED ORDER — OXYCODONE-ACETAMINOPHEN 5-325 MG PO TABS
2.0000 | ORAL_TABLET | ORAL | Status: DC | PRN
  Administered 2013-12-12: 2 via ORAL

## 2013-12-12 MED ORDER — ASPIRIN EC 325 MG PO TBEC
325.0000 mg | DELAYED_RELEASE_TABLET | Freq: Two times a day (BID) | ORAL | Status: DC
  Filled 2013-12-12 (×4): qty 1

## 2013-12-12 MED ORDER — OXYCODONE HCL 5 MG PO TABS
5.0000 mg | ORAL_TABLET | Freq: Once | ORAL | Status: AC | PRN
  Administered 2013-12-12: 5 mg via ORAL

## 2013-12-12 MED ORDER — METFORMIN HCL 500 MG PO TABS
500.0000 mg | ORAL_TABLET | Freq: Two times a day (BID) | ORAL | Status: DC
  Administered 2013-12-12 – 2013-12-13 (×2): 500 mg via ORAL
  Filled 2013-12-12 (×4): qty 1

## 2013-12-12 MED ORDER — PROMETHAZINE HCL 25 MG/ML IJ SOLN: 6.2500 mg | INTRAMUSCULAR | Status: DC | PRN

## 2013-12-12 MED ORDER — NEOSTIGMINE METHYLSULFATE 1 MG/ML IJ SOLN
INTRAMUSCULAR | Status: DC | PRN
  Administered 2013-12-12: 3 mg via INTRAVENOUS

## 2013-12-12 MED ORDER — LIDOCAINE HCL (CARDIAC) 20 MG/ML IV SOLN
INTRAVENOUS | Status: DC | PRN
  Administered 2013-12-12: 100 mg via INTRAVENOUS

## 2013-12-12 MED ORDER — DOCUSATE SODIUM 100 MG PO CAPS
100.0000 mg | ORAL_CAPSULE | Freq: Two times a day (BID) | ORAL | Status: DC
  Administered 2013-12-12: 100 mg via ORAL
  Filled 2013-12-12 (×2): qty 1

## 2013-12-12 MED ORDER — POLYETHYLENE GLYCOL 3350 17 G PO PACK: 17.0000 g | PACK | Freq: Every day | ORAL | Status: DC | PRN

## 2013-12-12 MED ORDER — FLEET ENEMA 7-19 GM/118ML RE ENEM: 1.0000 | ENEMA | Freq: Once | RECTAL | Status: AC | PRN

## 2013-12-12 MED ORDER — LISINOPRIL 5 MG PO TABS
5.0000 mg | ORAL_TABLET | Freq: Every day | ORAL | Status: DC
  Administered 2013-12-12: 5 mg via ORAL
  Filled 2013-12-12 (×2): qty 1

## 2013-12-12 MED ORDER — MENTHOL 3 MG MT LOZG: 1.0000 | LOZENGE | OROMUCOSAL | Status: DC | PRN

## 2013-12-12 MED ORDER — MIDAZOLAM HCL 5 MG/5ML IJ SOLN
INTRAMUSCULAR | Status: DC | PRN
  Administered 2013-12-12: 2 mg via INTRAVENOUS

## 2013-12-12 MED ORDER — EPHEDRINE SULFATE 50 MG/ML IJ SOLN
INTRAMUSCULAR | Status: DC | PRN
  Administered 2013-12-12 (×2): 5 mg via INTRAVENOUS
  Administered 2013-12-12: 10 mg via INTRAVENOUS

## 2013-12-12 MED ORDER — SODIUM CHLORIDE 0.9 % IV SOLN
10.0000 mg | INTRAVENOUS | Status: DC | PRN
  Administered 2013-12-12: 25 ug/min via INTRAVENOUS

## 2013-12-12 MED ORDER — HYDROMORPHONE HCL PF 1 MG/ML IJ SOLN
INTRAMUSCULAR | Status: AC
  Filled 2013-12-12: qty 1

## 2013-12-12 MED ORDER — FENTANYL CITRATE 0.05 MG/ML IJ SOLN
INTRAMUSCULAR | Status: DC | PRN
  Administered 2013-12-12 (×3): 50 ug via INTRAVENOUS
  Administered 2013-12-12: 100 ug via INTRAVENOUS

## 2013-12-12 MED ORDER — ONDANSETRON HCL 4 MG/2ML IJ SOLN: 4.0000 mg | Freq: Four times a day (QID) | INTRAMUSCULAR | Status: DC | PRN

## 2013-12-12 MED ORDER — ALUMINUM HYDROXIDE GEL 320 MG/5ML PO SUSP
15.0000 mL | ORAL | Status: DC | PRN
  Filled 2013-12-12: qty 30

## 2013-12-12 MED ORDER — PHENOL 1.4 % MT LIQD: 1.0000 | OROMUCOSAL | Status: DC | PRN

## 2013-12-12 MED ORDER — MOMETASONE FURO-FORMOTEROL FUM 100-5 MCG/ACT IN AERO
2.0000 | INHALATION_SPRAY | Freq: Two times a day (BID) | RESPIRATORY_TRACT | Status: DC
  Administered 2013-12-12 – 2013-12-13 (×2): 2 via RESPIRATORY_TRACT
  Filled 2013-12-12: qty 8.8

## 2013-12-12 MED ORDER — GLYCOPYRROLATE 0.2 MG/ML IJ SOLN
INTRAMUSCULAR | Status: DC | PRN
  Administered 2013-12-12: 0.2 mg via INTRAVENOUS
  Administered 2013-12-12: 0.4 mg via INTRAVENOUS

## 2013-12-12 MED ORDER — DIPHENHYDRAMINE HCL 50 MG/ML IJ SOLN
12.5000 mg | Freq: Once | INTRAMUSCULAR | Status: AC
  Administered 2013-12-12: 12.5 mg via INTRAVENOUS

## 2013-12-12 MED ORDER — HYDROCHLOROTHIAZIDE 25 MG PO TABS
25.0000 mg | ORAL_TABLET | Freq: Every day | ORAL | Status: DC
  Administered 2013-12-12: 25 mg via ORAL
  Filled 2013-12-12 (×2): qty 1

## 2013-12-12 MED ORDER — FLUOXETINE HCL 20 MG PO CAPS
40.0000 mg | ORAL_CAPSULE | Freq: Two times a day (BID) | ORAL | Status: DC
  Administered 2013-12-12: 40 mg via ORAL
  Filled 2013-12-12 (×3): qty 2

## 2013-12-12 MED ORDER — PROPOFOL 10 MG/ML IV BOLUS
INTRAVENOUS | Status: DC | PRN
  Administered 2013-12-12: 180 mg via INTRAVENOUS

## 2013-12-12 MED ORDER — OXYCODONE-ACETAMINOPHEN 5-325 MG PO TABS
ORAL_TABLET | ORAL | Status: AC
  Filled 2013-12-12: qty 2

## 2013-12-12 MED ORDER — ZOLPIDEM TARTRATE 5 MG PO TABS
5.0000 mg | ORAL_TABLET | Freq: Every evening | ORAL | Status: DC | PRN
  Administered 2013-12-13: 5 mg via ORAL
  Filled 2013-12-12: qty 1

## 2013-12-12 MED ORDER — SODIUM CHLORIDE 0.9 % IR SOLN
Status: DC | PRN
  Administered 2013-12-12: 1000 mL

## 2013-12-12 MED ORDER — LACTATED RINGERS IV SOLN
INTRAVENOUS | Status: DC
  Administered 2013-12-12 (×2): via INTRAVENOUS

## 2013-12-12 MED ORDER — OXYCODONE-ACETAMINOPHEN 5-325 MG PO TABS
1.0000 | ORAL_TABLET | ORAL | Status: DC | PRN
  Administered 2013-12-13 (×2): 2 via ORAL
  Filled 2013-12-12 (×2): qty 2

## 2013-12-12 MED ORDER — ACETAMINOPHEN 325 MG PO TABS: 650.0000 mg | ORAL_TABLET | Freq: Four times a day (QID) | ORAL | Status: DC | PRN

## 2013-12-12 MED ORDER — BUPROPION HCL ER (XL) 300 MG PO TB24
300.0000 mg | ORAL_TABLET | Freq: Every day | ORAL | Status: DC
  Filled 2013-12-12: qty 1

## 2013-12-12 MED ORDER — GLIPIZIDE 10 MG PO TABS
10.0000 mg | ORAL_TABLET | Freq: Every day | ORAL | Status: DC
  Administered 2013-12-13: 10 mg via ORAL
  Filled 2013-12-12 (×2): qty 1

## 2013-12-12 MED ORDER — HYDROMORPHONE HCL PF 1 MG/ML IJ SOLN
0.2500 mg | INTRAMUSCULAR | Status: DC | PRN
  Administered 2013-12-12 (×2): 0.5 mg via INTRAVENOUS

## 2013-12-12 MED ORDER — ONDANSETRON HCL 4 MG PO TABS: 4.0000 mg | ORAL_TABLET | Freq: Four times a day (QID) | ORAL | Status: DC | PRN

## 2013-12-12 MED ORDER — ROCURONIUM BROMIDE 100 MG/10ML IV SOLN
INTRAVENOUS | Status: DC | PRN
  Administered 2013-12-12: 50 mg via INTRAVENOUS

## 2013-12-12 MED ORDER — ALPRAZOLAM 0.5 MG PO TABS
1.0000 mg | ORAL_TABLET | Freq: Three times a day (TID) | ORAL | Status: DC | PRN
  Administered 2013-12-13: 1 mg via ORAL
  Filled 2013-12-12: qty 2

## 2013-12-12 SURGICAL SUPPLY — 56 items
ANCH SUT 1 SHRT SM RGD INSRTR (Anchor) ×1 IMPLANT
ANCHOR SUT 1.45 SZ 1 SHORT (Anchor) ×1 IMPLANT
BLADE AVERAGE 25X9 (BLADE) ×1 IMPLANT
CHLORAPREP W/TINT 26ML (MISCELLANEOUS) ×2 IMPLANT
CLSR STERI-STRIP ANTIMIC 1/2X4 (GAUZE/BANDAGES/DRESSINGS) ×1 IMPLANT
COVER SURGICAL LIGHT HANDLE (MISCELLANEOUS) ×3 IMPLANT
DRAPE INCISE IOBAN 66X45 STRL (DRAPES) ×2 IMPLANT
DRAPE OEC MINIVIEW 54X84 (DRAPES) ×1 IMPLANT
DRAPE U-SHAPE 47X51 STRL (DRAPES) ×2 IMPLANT
DRSG EMULSION OIL 3X3 NADH (GAUZE/BANDAGES/DRESSINGS) ×4 IMPLANT
DRSG MEPILEX BORDER 4X8 (GAUZE/BANDAGES/DRESSINGS) ×1 IMPLANT
DRSG PAD ABDOMINAL 8X10 ST (GAUZE/BANDAGES/DRESSINGS) ×4 IMPLANT
ELECT REM PT RETURN 9FT ADLT (ELECTROSURGICAL)
ELECTRODE REM PT RTRN 9FT ADLT (ELECTROSURGICAL) IMPLANT
GLOVE BIO SURGEON STRL SZ 6.5 (GLOVE) ×1 IMPLANT
GLOVE BIO SURGEON STRL SZ7 (GLOVE) ×4 IMPLANT
GLOVE BIO SURGEON STRL SZ7.5 (GLOVE) ×2 IMPLANT
GLOVE BIOGEL PI IND STRL 7.0 (GLOVE) ×1 IMPLANT
GLOVE BIOGEL PI IND STRL 8 (GLOVE) ×1 IMPLANT
GLOVE BIOGEL PI INDICATOR 7.0 (GLOVE) ×3
GLOVE BIOGEL PI INDICATOR 8 (GLOVE) ×1
GOWN PREVENTION PLUS LG XLONG (DISPOSABLE) ×2 IMPLANT
GOWN PREVENTION PLUS XLARGE (GOWN DISPOSABLE) ×2 IMPLANT
GOWN STRL NON-REIN LRG LVL3 (GOWN DISPOSABLE) ×4 IMPLANT
GRAFT TISS SEMITEND 4-8 (Bone Implant) IMPLANT
KIT BASIN OR (CUSTOM PROCEDURE TRAY) ×2 IMPLANT
KIT BIO-TENODESIS 3X8 DISP (MISCELLANEOUS) ×2
KIT INSRT BABSR STRL DISP BTN (MISCELLANEOUS) IMPLANT
KIT ROOM TURNOVER OR (KITS) ×2 IMPLANT
MANIFOLD NEPTUNE WASTE (CANNULA) ×2 IMPLANT
NDL HYPO 25GX1X1/2 BEV (NEEDLE) ×1 IMPLANT
NEEDLE HYPO 25GX1X1/2 BEV (NEEDLE) ×2 IMPLANT
NS IRRIG 1000ML POUR BTL (IV SOLUTION) ×2 IMPLANT
PACK SHOULDER (CUSTOM PROCEDURE TRAY) ×2 IMPLANT
PAD ARMBOARD 7.5X6 YLW CONV (MISCELLANEOUS) ×4 IMPLANT
RETRIEVER SUT HEWSON (MISCELLANEOUS) ×2 IMPLANT
SLING ARM FOAM STRAP LRG (SOFTGOODS) ×2 IMPLANT
SLING ARM FOAM STRAP MED (SOFTGOODS) IMPLANT
SPONGE GAUZE 4X4 12PLY (GAUZE/BANDAGES/DRESSINGS) ×2 IMPLANT
STRIP CLOSURE SKIN 1/2X4 (GAUZE/BANDAGES/DRESSINGS) ×2 IMPLANT
SUCTION FRAZIER TIP 10 FR DISP (SUCTIONS) ×2 IMPLANT
SUT FIBERWIRE #2 38 T-5 BLUE (SUTURE) ×4
SUT MON AB 4-0 PC3 18 (SUTURE) ×2 IMPLANT
SUT PDS AB 1 CT  36 (SUTURE) ×1
SUT PDS AB 1 CT 36 (SUTURE) ×1 IMPLANT
SUT VIC AB 2-0 CT1 27 (SUTURE) ×2
SUT VIC AB 2-0 CT1 TAPERPNT 27 (SUTURE) IMPLANT
SUT VICRYL 0 CT 1 36IN (SUTURE) ×2 IMPLANT
SUTURE FIBERWR #2 38 T-5 BLUE (SUTURE) IMPLANT
SYR CONTROL 10ML LL (SYRINGE) ×2 IMPLANT
TAPE FIBER 2MM 7IN #2 BLUE (SUTURE) ×1 IMPLANT
TENDON SEMI-TENDINOSUS (Bone Implant) ×2 IMPLANT
TOWEL OR 17X24 6PK STRL BLUE (TOWEL DISPOSABLE) ×2 IMPLANT
TOWEL OR 17X26 10 PK STRL BLUE (TOWEL DISPOSABLE) ×2 IMPLANT
TUBE CONNECTING 12X1/4 (SUCTIONS) ×2 IMPLANT
WATER STERILE IRR 1000ML POUR (IV SOLUTION) ×2 IMPLANT

## 2013-12-12 NOTE — Anesthesia Postprocedure Evaluation (Signed)
  Anesthesia Post-op Note  Patient: Alison Kim  Procedure(s) Performed: Procedure(s) with comments: RECONSTRUCTION OF CORACOCLAVICULAR LIGAMENT (Left) - Left coracoclavicular ligament reconstruction with allograft; distal clavical excision   Patient Location: PACU  Anesthesia Type:General  Level of Consciousness: awake  Airway and Oxygen Therapy: Patient Spontanous Breathing  Post-op Pain: mild  Post-op Assessment: Post-op Vital signs reviewed, Patient's Cardiovascular Status Stable, Respiratory Function Stable, Patent Airway, No signs of Nausea or vomiting and Pain level controlled  Post-op Vital Signs: Reviewed and stable  Complications: No apparent anesthesia complications

## 2013-12-12 NOTE — Progress Notes (Signed)
Pt pain more tolerable sleeping comfortably but o2 sats drop to 82-84 on ra when placed on 2l/[place] o2 ssats up to 90-96 will notify dr Ave Filter for possible overnight stay
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
## 2013-12-12 NOTE — Transfer of Care (Signed)
Immediate Anesthesia Transfer of Care Note  Patient: Alison Kim  Procedure(s) Performed: Procedure(s) with comments: RECONSTRUCTION OF CORACOCLAVICULAR LIGAMENT (Left) - Left coracoclavicular ligament reconstruction with allograft; distal clavical excision   Patient Location: PACU  Anesthesia Type:General  Level of Consciousness: awake, alert  and oriented  Airway & Oxygen Therapy: Patient Spontanous Breathing and Patient connected to nasal cannula oxygen  Post-op Assessment: Report given to PACU RN, Post -op Vital signs reviewed and stable and Patient moving all extremities X 4  Post vital signs: Reviewed and stable  Complications: No apparent anesthesia complications

## 2013-12-12 NOTE — Op Note (Signed)
Procedure(s): RECONSTRUCTION OF CORACOCLAVICULAR LIGAMENT Procedure Note  Alison Kim female 47 y.o. 12/12/2013  Procedure(s) and Anesthesia Type:   #1 Reconstruction of Left coracoclavicular ligaments and a.c. Joint with allograft   #2 Left distal clavicle excision  Surgeon(s) and Role:    * Mable Paris, MD - Primary   Indications:  47 y.o. female s/p fall with left Type V- AC separation who failed conservative management with continued severe instability and with chronic pain and mechanical symptoms.     Surgeon: Mable Paris   Assistants: Damita Lack PA-C [facility] was present and scrubbed throughout the procedure and was essential in positioning, retraction, exposure, and closure)  Anesthesia: General endotracheal anesthesia    Procedure Detail  RECONSTRUCTION OF CORACOCLAVICULAR LIGAMENT  Findings: She had complete disruption of the coracoclavicular ligaments. The distal clavicle was arthritic. 10 mm the distal clavicle was excised and the coracoclavicular ligaments were reconstructed with a fiber tape and a semitendinosus allograft underneath the coracoid and through tunnels in the clavicle.  Estimated Blood Loss:  less than 50 mL         Drains: none  Blood Given: none         Specimens: none        Complications:  * No complications entered in OR log *         Disposition: PACU - hemodynamically stable.         Condition: stable    Procedure:  DESCRIPTION OF PROCEDURE: The patient was identified in preoperative  holding area where I personally marked the operative site after  verifying site, side, and procedure with the patient. The patient was taken back  to the operating room where general anesthesia was induced without  complication and was placed in the beach-chair position with the back  elevated about 40 degrees and all extremities carefully padded and  positioned. The neck was turned very slightly away from the  operative field  to assist in exposure. The left upper extremity was then prepped and  draped in a standard sterile fashion. The appropriate time-out  procedure was carried out. The patient did receive IV antibiotics  within 30 minutes of incision.  An incision was made in Venturia lines extending from just posterior to the distal clavicle anteriorly to the coracoid. Dissection was carried down to subcutaneous tissues and skin flaps were elevated medially and laterally. The fascia was opened over the clavicle in line with the distal clavicle. Careful dissection was then made down to the superior aspect of the coracoid which was developed medially and laterally. A cooley clamp was then used to pass a #1 Vicryl beneath the coracoid. Great care was taken to protect underlying neurovascular structures. The distal clavicle was then again exposed and 10 mm of the arthritic distal clavicle were excised using an oscillating saw. 2 5 mm tunnels were then made with the cannulated system approximately 35 mm neck cut and through the medial hole, and 15 mm more lateral portal lateral hole.  The Vicryl suture was then used to pass the prepared graft and a fiber tape beneath the coracoid. A suture passing device was then used to pass all of these through the medial and lateral bone tunnels in the clavicle. Clavicle was then held reduced and the reduction was verified with fluoroscopic imaging. The fiber tape was then tied. The graft was then tied over the bone bridge and secured to itself with several #2 FiberWire sutures. The 2 strands of the graft were then  brought over the a.c. joint into a juggerknot anchor which was placed in the lateral aspect of the acromion to reconstruct the superior joint ligaments.  The remainder of the nonuse graft was cut and discarded. The wound is copiously irrigated with normal saline. The deltotrapezial fascia was then closed over the construct using #1 PDS suture in interrupted figure-of-eight  fashion.   The skin was then closed with 2-0 Vicryl in a deep  dermal layer, 4-0 Monocryl for skin closure. Steri-Strips were applied.  10 mL of 0.5% Marcaine with epinephrine were infiltrated for  postoperative pain. Sterile dressings were applied including a medium  Mepilex dressing. The patient was then allowed to awaken from general  anesthesia, placed in a sling, transferred to stretcher and taken to the  recovery room in stable condition.   POSTOPERATIVE PLAN: She'll be discharged home with her family and will followup in 2 weeks for wound check. She will remain in her sling until that time.

## 2013-12-12 NOTE — Preoperative (Signed)
Beta Blockers   Reason not to administer Beta Blockers:Not Applicable 

## 2013-12-12 NOTE — Anesthesia Preprocedure Evaluation (Addendum)
Anesthesia Evaluation  Patient identified by MRN, date of birth, ID band Patient awake    Reviewed: Allergy & Precautions, H&P , NPO status , Patient's Chart, lab work & pertinent test results  History of Anesthesia Complications Negative for: history of anesthetic complications  Airway Mallampati: II  Neck ROM: Full    Dental  (+) Edentulous Upper   Pulmonary shortness of breath, COPDCurrent Smoker,  + rhonchi         Cardiovascular hypertension, Rhythm:Regular Rate:Normal     Neuro/Psych Anxiety Depression  Neuromuscular disease    GI/Hepatic hiatal hernia, GERD-  ,  Endo/Other  diabetes  Renal/GU      Musculoskeletal   Abdominal (+) + obese,   Peds  Hematology   Anesthesia Other Findings   Reproductive/Obstetrics                          Anesthesia Physical Anesthesia Plan  ASA: III  Anesthesia Plan: General   Post-op Pain Management:    Induction: Intravenous  Airway Management Planned: Oral ETT  Additional Equipment:   Intra-op Plan:   Post-operative Plan: Extubation in OR  Informed Consent: I have reviewed the patients History and Physical, chart, labs and discussed the procedure including the risks, benefits and alternatives for the proposed anesthesia with the patient or authorized representative who has indicated his/her understanding and acceptance.   Dental advisory given  Plan Discussed with: CRNA and Surgeon  Anesthesia Plan Comments:         Anesthesia Quick Evaluation

## 2013-12-12 NOTE — Progress Notes (Signed)
PT C/O ITCHING ALL OVER DR Gypsy Balsam CALLED ORDERS OBTAINED AND CARRIED OUT

## 2013-12-12 NOTE — Progress Notes (Signed)
Sister in with pt dentures returned to pt

## 2013-12-12 NOTE — Progress Notes (Signed)
Dr Ave Filter notifed of pts sats=84-85 on ra sats on 2l/=90-96 awaiting for return call for possible admission dr Ave Filter IN SURGERY AT PRESENT

## 2013-12-13 ENCOUNTER — Encounter (HOSPITAL_COMMUNITY): Payer: Self-pay | Admitting: General Practice

## 2013-12-13 DIAGNOSIS — M24419 Recurrent dislocation, unspecified shoulder: Secondary | ICD-10-CM | POA: Diagnosis not present

## 2013-12-13 MED ORDER — DOCUSATE SODIUM 100 MG PO CAPS: 100.0000 mg | ORAL_CAPSULE | Freq: Three times a day (TID) | ORAL | Status: DC | PRN

## 2013-12-13 MED ORDER — OXYCODONE-ACETAMINOPHEN 7.5-325 MG PO TABS: 1.0000 | ORAL_TABLET | ORAL | Status: DC | PRN

## 2013-12-13 NOTE — Progress Notes (Signed)
PATIENT ID: Alison Kim   1 Day Post-Op Procedure(s) (LRB): RECONSTRUCTION OF CORACOCLAVICULAR LIGAMENT (Left)  Subjective: Reports continued pain in left shoulder, not under control. Denies numbness and tingling in L UE. Was on pain medication chronically over the last year and we dicussed that this may make pain control more difficult. No other problems or concerns.   Objective:  Filed Vitals:   12/13/13 0544  BP: 134/76  Pulse: 99  Temp: 99.1 F (37.3 C)  Resp: 18    On room air in no respiratory distress L UE with no swelling, erythema, warmth Dressing c/d/i In sling Distally NVI  Labs:  No results found for this basename: HGB,  in the last 72 hoursNo results found for this basename: WBC, RBC, HCT, PLT,  in the last 72 hoursNo results found for this basename: NA, K, CL, CO2, BUN, CREATININE, GLUCOSE, CALCIUM,  in the last 72 hours  Assessment and Plan: Day 1 s/p left reconstruction of CC ligament, held overnight for pain control and low o2 saturation Off nasal cannula this am and doing well Pain control will be difficult, will plan on discharging her today with Percocet 7.5/325 once pain under control with oral medication, script in chart FU Dr. Ave Filter 10 days   VTE proph: ASA 325mg  BID
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
## 2014-04-27 ENCOUNTER — Encounter (HOSPITAL_COMMUNITY): Payer: Self-pay | Admitting: Emergency Medicine

## 2014-04-27 ENCOUNTER — Emergency Department (HOSPITAL_COMMUNITY): Payer: Medicare Other

## 2014-04-27 ENCOUNTER — Emergency Department (HOSPITAL_COMMUNITY)
Admission: EM | Admit: 2014-04-27 | Discharge: 2014-04-27 | Disposition: A | Discharge status: Home / Self Care | Payer: Medicare Other | Attending: Emergency Medicine | Admitting: Emergency Medicine

## 2014-04-27 DIAGNOSIS — F411 Generalized anxiety disorder: Secondary | ICD-10-CM | POA: Insufficient documentation

## 2014-04-27 DIAGNOSIS — Y929 Unspecified place or not applicable: Secondary | ICD-10-CM | POA: Insufficient documentation

## 2014-04-27 DIAGNOSIS — G8929 Other chronic pain: Secondary | ICD-10-CM | POA: Insufficient documentation

## 2014-04-27 DIAGNOSIS — J449 Chronic obstructive pulmonary disease, unspecified: Secondary | ICD-10-CM | POA: Insufficient documentation

## 2014-04-27 DIAGNOSIS — I10 Essential (primary) hypertension: Secondary | ICD-10-CM | POA: Insufficient documentation

## 2014-04-27 DIAGNOSIS — S59919A Unspecified injury of unspecified forearm, initial encounter: Secondary | ICD-10-CM

## 2014-04-27 DIAGNOSIS — F17200 Nicotine dependence, unspecified, uncomplicated: Secondary | ICD-10-CM | POA: Insufficient documentation

## 2014-04-27 DIAGNOSIS — S62309A Unspecified fracture of unspecified metacarpal bone, initial encounter for closed fracture: Secondary | ICD-10-CM | POA: Insufficient documentation

## 2014-04-27 DIAGNOSIS — Z79899 Other long term (current) drug therapy: Secondary | ICD-10-CM | POA: Insufficient documentation

## 2014-04-27 DIAGNOSIS — IMO0002 Reserved for concepts with insufficient information to code with codable children: Secondary | ICD-10-CM | POA: Insufficient documentation

## 2014-04-27 DIAGNOSIS — G43909 Migraine, unspecified, not intractable, without status migrainosus: Secondary | ICD-10-CM | POA: Insufficient documentation

## 2014-04-27 DIAGNOSIS — Z880 Allergy status to penicillin: Secondary | ICD-10-CM | POA: Insufficient documentation

## 2014-04-27 DIAGNOSIS — S59909A Unspecified injury of unspecified elbow, initial encounter: Secondary | ICD-10-CM | POA: Insufficient documentation

## 2014-04-27 DIAGNOSIS — Z8701 Personal history of pneumonia (recurrent): Secondary | ICD-10-CM | POA: Insufficient documentation

## 2014-04-27 DIAGNOSIS — Z8742 Personal history of other diseases of the female genital tract: Secondary | ICD-10-CM | POA: Insufficient documentation

## 2014-04-27 DIAGNOSIS — S62308A Unspecified fracture of other metacarpal bone, initial encounter for closed fracture: Secondary | ICD-10-CM

## 2014-04-27 DIAGNOSIS — S6990XA Unspecified injury of unspecified wrist, hand and finger(s), initial encounter: Secondary | ICD-10-CM

## 2014-04-27 DIAGNOSIS — W540XXA Bitten by dog, initial encounter: Secondary | ICD-10-CM | POA: Insufficient documentation

## 2014-04-27 DIAGNOSIS — F4310 Post-traumatic stress disorder, unspecified: Secondary | ICD-10-CM | POA: Insufficient documentation

## 2014-04-27 DIAGNOSIS — Y9389 Activity, other specified: Secondary | ICD-10-CM | POA: Insufficient documentation

## 2014-04-27 DIAGNOSIS — F329 Major depressive disorder, single episode, unspecified: Secondary | ICD-10-CM | POA: Insufficient documentation

## 2014-04-27 DIAGNOSIS — F3289 Other specified depressive episodes: Secondary | ICD-10-CM | POA: Insufficient documentation

## 2014-04-27 DIAGNOSIS — E119 Type 2 diabetes mellitus without complications: Secondary | ICD-10-CM | POA: Insufficient documentation

## 2014-04-27 DIAGNOSIS — E785 Hyperlipidemia, unspecified: Secondary | ICD-10-CM | POA: Insufficient documentation

## 2014-04-27 DIAGNOSIS — J4489 Other specified chronic obstructive pulmonary disease: Secondary | ICD-10-CM | POA: Insufficient documentation

## 2014-04-27 DIAGNOSIS — Z8739 Personal history of other diseases of the musculoskeletal system and connective tissue: Secondary | ICD-10-CM | POA: Insufficient documentation

## 2014-04-27 DIAGNOSIS — Z8719 Personal history of other diseases of the digestive system: Secondary | ICD-10-CM | POA: Insufficient documentation

## 2014-04-27 MED ORDER — OXYCODONE-ACETAMINOPHEN 5-325 MG PO TABS: 1.0000 | ORAL_TABLET | Freq: Four times a day (QID) | ORAL | Status: DC | PRN

## 2014-04-27 MED ORDER — HYDROCODONE-ACETAMINOPHEN 5-325 MG PO TABS
1.0000 | ORAL_TABLET | Freq: Once | ORAL | Status: AC
  Administered 2014-04-27: 1 via ORAL
  Filled 2014-04-27: qty 1

## 2014-04-27 NOTE — ED Notes (Signed)
Ortho tech at bedside for splint application

## 2014-04-27 NOTE — ED Notes (Signed)
Pt states that she was separating two pit bulls who were fighting.  States that the dog bit her rt hand.  Both dogs are up to date on their rabies.
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
## 2014-04-27 NOTE — ED Notes (Signed)
Pt alert, nad, resp even unlabored, skin pwd, denies needs, ambulates to discharge 

## 2014-04-27 NOTE — Discharge Instructions (Signed)
Please read and follow all provided instructions.  Your diagnoses today include:  1. Fracture of fifth metacarpal bone     Tests performed today include:  An x-ray of the affected area - shows broken 5th metacarpal  Vital signs. See below for your results today.   Medications prescribed:   Percocet (oxycodone/acetaminophen) - narcotic pain medication  DO NOT drive or perform any activities that require you to be awake and alert because this medicine can make you drowsy. BE VERY CAREFUL not to take multiple medicines containing Tylenol (also called acetaminophen). Doing so can lead to an overdose which can damage your liver and cause liver failure and possibly death.  Take any prescribed medications only as directed.  Home care instructions:   Follow any educational materials contained in this packet  Follow R.I.C.E. Protocol:  R - rest your injury   I  - use ice on injury without applying directly to skin  C - compress injury with bandage or splint  E - elevate the injury as much as possible  Follow-up instructions: Please follow-up with your orthopedic physician (bone specialist) in 5-7 days.   If you do not have a primary care doctor -- see below for referral information.   Return instructions:   Please return if your toes are numb or tingling, appear gray or blue, or you have severe pain (also elevate leg and loosen splint or wrap if you were given one)  Please return to the Emergency Department if you experience worsening symptoms.   Please return if you have any other emergent concerns.  Additional Information:  Your vital signs today were: BP 107/68   Pulse 120   Temp(Src) 98 F (36.7 C) (Oral)   Resp 18   SpO2 100% If your blood pressure (BP) was elevated above 135/85 this visit, please have this repeated by your doctor within one month. --------------

## 2014-04-27 NOTE — ED Provider Notes (Signed)
Medical screening examination/treatment/procedure(s) were performed by non-physician practitioner and as supervising physician I was immediately available for consultation/collaboration.   EKG Interpretation None       Jayline Kilburg R. Westen Dinino, MD 04/27/14 2333 

## 2014-04-27 NOTE — ED Notes (Signed)
Pt states she has a ride home. 

## 2014-12-31 ENCOUNTER — Emergency Department (HOSPITAL_COMMUNITY): Payer: Medicare Other

## 2014-12-31 ENCOUNTER — Emergency Department (HOSPITAL_COMMUNITY)
Admission: EM | Admit: 2014-12-31 | Discharge: 2014-12-31 | Disposition: A | Discharge status: Home / Self Care | Payer: Medicare Other | Attending: Emergency Medicine | Admitting: Emergency Medicine

## 2014-12-31 ENCOUNTER — Encounter (HOSPITAL_COMMUNITY): Payer: Self-pay | Admitting: Emergency Medicine

## 2014-12-31 DIAGNOSIS — Z880 Allergy status to penicillin: Secondary | ICD-10-CM | POA: Diagnosis not present

## 2014-12-31 DIAGNOSIS — E119 Type 2 diabetes mellitus without complications: Secondary | ICD-10-CM | POA: Insufficient documentation

## 2014-12-31 DIAGNOSIS — F329 Major depressive disorder, single episode, unspecified: Secondary | ICD-10-CM | POA: Diagnosis not present

## 2014-12-31 DIAGNOSIS — Y998 Other external cause status: Secondary | ICD-10-CM | POA: Diagnosis not present

## 2014-12-31 DIAGNOSIS — M545 Low back pain, unspecified: Secondary | ICD-10-CM

## 2014-12-31 DIAGNOSIS — F419 Anxiety disorder, unspecified: Secondary | ICD-10-CM | POA: Insufficient documentation

## 2014-12-31 DIAGNOSIS — Z79899 Other long term (current) drug therapy: Secondary | ICD-10-CM | POA: Diagnosis not present

## 2014-12-31 DIAGNOSIS — Z8719 Personal history of other diseases of the digestive system: Secondary | ICD-10-CM | POA: Diagnosis not present

## 2014-12-31 DIAGNOSIS — G43909 Migraine, unspecified, not intractable, without status migrainosus: Secondary | ICD-10-CM | POA: Diagnosis not present

## 2014-12-31 DIAGNOSIS — Z720 Tobacco use: Secondary | ICD-10-CM | POA: Diagnosis not present

## 2014-12-31 DIAGNOSIS — W010XXA Fall on same level from slipping, tripping and stumbling without subsequent striking against object, initial encounter: Secondary | ICD-10-CM | POA: Diagnosis not present

## 2014-12-31 DIAGNOSIS — Z87448 Personal history of other diseases of urinary system: Secondary | ICD-10-CM | POA: Diagnosis not present

## 2014-12-31 DIAGNOSIS — G8929 Other chronic pain: Secondary | ICD-10-CM | POA: Insufficient documentation

## 2014-12-31 DIAGNOSIS — M1990 Unspecified osteoarthritis, unspecified site: Secondary | ICD-10-CM | POA: Diagnosis not present

## 2014-12-31 DIAGNOSIS — E785 Hyperlipidemia, unspecified: Secondary | ICD-10-CM | POA: Diagnosis not present

## 2014-12-31 DIAGNOSIS — J449 Chronic obstructive pulmonary disease, unspecified: Secondary | ICD-10-CM | POA: Diagnosis not present

## 2014-12-31 DIAGNOSIS — Z8701 Personal history of pneumonia (recurrent): Secondary | ICD-10-CM | POA: Diagnosis not present

## 2014-12-31 DIAGNOSIS — Z7951 Long term (current) use of inhaled steroids: Secondary | ICD-10-CM | POA: Insufficient documentation

## 2014-12-31 DIAGNOSIS — Y9289 Other specified places as the place of occurrence of the external cause: Secondary | ICD-10-CM | POA: Diagnosis not present

## 2014-12-31 DIAGNOSIS — Z791 Long term (current) use of non-steroidal anti-inflammatories (NSAID): Secondary | ICD-10-CM | POA: Diagnosis not present

## 2014-12-31 DIAGNOSIS — I10 Essential (primary) hypertension: Secondary | ICD-10-CM | POA: Insufficient documentation

## 2014-12-31 DIAGNOSIS — Y9389 Activity, other specified: Secondary | ICD-10-CM | POA: Insufficient documentation

## 2014-12-31 DIAGNOSIS — S3992XA Unspecified injury of lower back, initial encounter: Secondary | ICD-10-CM | POA: Insufficient documentation

## 2014-12-31 MED ORDER — IBUPROFEN 600 MG PO TABS: 600.0000 mg | ORAL_TABLET | Freq: Four times a day (QID) | ORAL | Status: DC | PRN

## 2014-12-31 MED ORDER — OXYCODONE-ACETAMINOPHEN 5-325 MG PO TABS: 1.0000 | ORAL_TABLET | ORAL | Status: DC | PRN

## 2014-12-31 MED ORDER — KETOROLAC TROMETHAMINE 60 MG/2ML IM SOLN
60.0000 mg | Freq: Once | INTRAMUSCULAR | Status: AC
  Administered 2014-12-31: 60 mg via INTRAMUSCULAR
  Filled 2014-12-31: qty 2

## 2014-12-31 MED ORDER — METHOCARBAMOL 500 MG PO TABS: 1000.0000 mg | ORAL_TABLET | Freq: Three times a day (TID) | ORAL | Status: DC | PRN

## 2014-12-31 MED ORDER — METHOCARBAMOL 500 MG PO TABS
1000.0000 mg | ORAL_TABLET | Freq: Once | ORAL | Status: AC
  Administered 2014-12-31: 1000 mg via ORAL
  Filled 2014-12-31: qty 2
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
## 2014-12-31 NOTE — Discharge Instructions (Signed)
Back Pain, Adult Low back pain is very common. About 1 in 5 people have back pain.The cause of low back pain is rarely dangerous. The pain often gets better over time.About half of people with a sudden onset of back pain feel better in just 2 weeks. About 8 in 10 people feel better by 6 weeks.  CAUSES Some common causes of back pain include:  Strain of the muscles or ligaments supporting the spine.  Wear and tear (degeneration) of the spinal discs.  Arthritis.  Direct injury to the back. DIAGNOSIS Most of the time, the direct cause of low back pain is not known.However, back pain can be treated effectively even when the exact cause of the pain is unknown.Answering your caregiver's questions about your overall health and symptoms is one of the most accurate ways to make sure the cause of your pain is not dangerous. If your caregiver needs more information, he or she may order lab work or imaging tests (X-rays or MRIs).However, even if imaging tests show changes in your back, this usually does not require surgery. HOME CARE INSTRUCTIONS For many people, back pain returns.Since low back pain is rarely dangerous, it is often a condition that people can learn to manageon their own.   Remain active. It is stressful on the back to sit or stand in one place. Do not sit, drive, or stand in one place for more than 30 minutes at a time. Take short walks on level surfaces as soon as pain allows.Try to increase the length of time you walk each day.  Do not stay in bed.Resting more than 1 or 2 days can delay your recovery.  Do not avoid exercise or work.Your body is made to move.It is not dangerous to be active, even though your back may hurt.Your back will likely heal faster if you return to being active before your pain is gone.  Pay attention to your body when you bend and lift. Many people have less discomfortwhen lifting if they bend their knees, keep the load close to their bodies,and  avoid twisting. Often, the most comfortable positions are those that put less stress on your recovering back.  Find a comfortable position to sleep. Use a firm mattress and lie on your side with your knees slightly bent. If you lie on your back, put a pillow under your knees.  Only take over-the-counter or prescription medicines as directed by your caregiver. Over-the-counter medicines to reduce pain and inflammation are often the most helpful.Your caregiver may prescribe muscle relaxant drugs.These medicines help dull your pain so you can more quickly return to your normal activities and healthy exercise.  Put ice on the injured area.  Put ice in a plastic bag.  Place a towel between your skin and the bag.  Leave the ice on for 15-20 minutes, 03-04 times a day for the first 2 to 3 days. After that, ice and heat may be alternated to reduce pain and spasms.  Ask your caregiver about trying back exercises and gentle massage. This may be of some benefit.  Avoid feeling anxious or stressed.Stress increases muscle tension and can worsen back pain.It is important to recognize when you are anxious or stressed and learn ways to manage it.Exercise is a great option. SEEK MEDICAL CARE IF:  You have pain that is not relieved with rest or medicine.  You have pain that does not improve in 1 week.  You have new symptoms.  You are generally not feeling well. SEEK   IMMEDIATE MEDICAL CARE IF:   You have pain that radiates from your back into your legs.  You develop new bowel or bladder control problems.  You have unusual weakness or numbness in your arms or legs.  You develop nausea or vomiting.  You develop abdominal pain.  You feel faint. Document Released: 11/30/2005 Document Revised: 05/31/2012 Document Reviewed: 04/03/2014 [employer] Patient Information 2015 [employer]. This information is not intended to replace advice given to you by your health care provider. Make sure you  discuss any questions you have with your health care provider.  

## 2014-12-31 NOTE — ED Notes (Signed)
Bed: [facility] Expected date:  Expected time:  Means of arrival:  Comments: Back pain

## 2014-12-31 NOTE — ED Notes (Signed)
Pt arrived with a complaint of back pain.  Pt has chronic back pain.  Pt has not taken medications for over a year.  Pt states that she got to a point tonight that she hurt so bad she had to seek relief.

## 2015-04-15 DIAGNOSIS — F329 Major depressive disorder, single episode, unspecified: Secondary | ICD-10-CM | POA: Insufficient documentation

## 2015-04-15 DIAGNOSIS — S8001XA Contusion of right knee, initial encounter: Secondary | ICD-10-CM | POA: Insufficient documentation

## 2015-04-15 DIAGNOSIS — Y9389 Activity, other specified: Secondary | ICD-10-CM | POA: Diagnosis not present

## 2015-04-15 DIAGNOSIS — Y998 Other external cause status: Secondary | ICD-10-CM | POA: Diagnosis not present

## 2015-04-15 DIAGNOSIS — S300XXA Contusion of lower back and pelvis, initial encounter: Secondary | ICD-10-CM | POA: Diagnosis not present

## 2015-04-15 DIAGNOSIS — I10 Essential (primary) hypertension: Secondary | ICD-10-CM | POA: Insufficient documentation

## 2015-04-15 DIAGNOSIS — Z880 Allergy status to penicillin: Secondary | ICD-10-CM | POA: Insufficient documentation

## 2015-04-15 DIAGNOSIS — W010XXA Fall on same level from slipping, tripping and stumbling without subsequent striking against object, initial encounter: Secondary | ICD-10-CM | POA: Insufficient documentation

## 2015-04-15 DIAGNOSIS — G43909 Migraine, unspecified, not intractable, without status migrainosus: Secondary | ICD-10-CM | POA: Insufficient documentation

## 2015-04-15 DIAGNOSIS — S79912A Unspecified injury of left hip, initial encounter: Secondary | ICD-10-CM | POA: Diagnosis not present

## 2015-04-15 DIAGNOSIS — Z791 Long term (current) use of non-steroidal anti-inflammatories (NSAID): Secondary | ICD-10-CM | POA: Diagnosis not present

## 2015-04-15 DIAGNOSIS — Z9181 History of falling: Secondary | ICD-10-CM | POA: Insufficient documentation

## 2015-04-15 DIAGNOSIS — Z7951 Long term (current) use of inhaled steroids: Secondary | ICD-10-CM | POA: Diagnosis not present

## 2015-04-15 DIAGNOSIS — F419 Anxiety disorder, unspecified: Secondary | ICD-10-CM | POA: Insufficient documentation

## 2015-04-15 DIAGNOSIS — J449 Chronic obstructive pulmonary disease, unspecified: Secondary | ICD-10-CM | POA: Insufficient documentation

## 2015-04-15 DIAGNOSIS — Z79899 Other long term (current) drug therapy: Secondary | ICD-10-CM | POA: Insufficient documentation

## 2015-04-15 DIAGNOSIS — G8929 Other chronic pain: Secondary | ICD-10-CM | POA: Insufficient documentation

## 2015-04-15 DIAGNOSIS — Z87828 Personal history of other (healed) physical injury and trauma: Secondary | ICD-10-CM | POA: Diagnosis not present

## 2015-04-15 DIAGNOSIS — K219 Gastro-esophageal reflux disease without esophagitis: Secondary | ICD-10-CM | POA: Diagnosis not present

## 2015-04-15 DIAGNOSIS — Z87448 Personal history of other diseases of urinary system: Secondary | ICD-10-CM | POA: Insufficient documentation

## 2015-04-15 DIAGNOSIS — Y92009 Unspecified place in unspecified non-institutional (private) residence as the place of occurrence of the external cause: Secondary | ICD-10-CM | POA: Diagnosis not present

## 2015-04-15 DIAGNOSIS — S8991XA Unspecified injury of right lower leg, initial encounter: Secondary | ICD-10-CM | POA: Diagnosis present

## 2015-04-15 DIAGNOSIS — E119 Type 2 diabetes mellitus without complications: Secondary | ICD-10-CM | POA: Diagnosis not present

## 2015-04-15 DIAGNOSIS — M1990 Unspecified osteoarthritis, unspecified site: Secondary | ICD-10-CM | POA: Diagnosis not present

## 2015-04-15 DIAGNOSIS — E785 Hyperlipidemia, unspecified: Secondary | ICD-10-CM | POA: Insufficient documentation

## 2015-04-15 DIAGNOSIS — Z8701 Personal history of pneumonia (recurrent): Secondary | ICD-10-CM | POA: Insufficient documentation

## 2015-04-15 DIAGNOSIS — Z720 Tobacco use: Secondary | ICD-10-CM | POA: Insufficient documentation

## 2015-04-16 ENCOUNTER — Emergency Department (HOSPITAL_COMMUNITY)
Admission: EM | Admit: 2015-04-16 | Discharge: 2015-04-16 | Disposition: A | Discharge status: Home / Self Care | Payer: Medicare Other | Attending: Emergency Medicine | Admitting: Emergency Medicine

## 2015-04-16 ENCOUNTER — Encounter (HOSPITAL_COMMUNITY): Payer: Self-pay

## 2015-04-16 ENCOUNTER — Emergency Department (HOSPITAL_COMMUNITY): Payer: Medicare Other

## 2015-04-16 DIAGNOSIS — S8001XA Contusion of right knee, initial encounter: Secondary | ICD-10-CM

## 2015-04-16 DIAGNOSIS — M25552 Pain in left hip: Secondary | ICD-10-CM

## 2015-04-16 DIAGNOSIS — W19XXXA Unspecified fall, initial encounter: Secondary | ICD-10-CM

## 2015-04-16 MED ORDER — HYDROCODONE-ACETAMINOPHEN 5-325 MG PO TABS: 1.0000 | ORAL_TABLET | Freq: Four times a day (QID) | ORAL | Status: DC | PRN

## 2015-04-16 MED ORDER — HYDROCODONE-ACETAMINOPHEN 5-325 MG PO TABS
2.0000 | ORAL_TABLET | ORAL | Status: AC
  Administered 2015-04-16: 2 via ORAL
  Filled 2015-04-16: qty 2

## 2015-04-16 NOTE — Discharge Instructions (Signed)
Arthralgia Arthralgia is joint pain. A joint is a place where two bones meet. Joint pain can happen for many reasons. The joint can be bruised, stiff, infected, or weak from aging. Pain usually goes away after resting and taking medicine for soreness.  HOME CARE  Rest the joint as told by your doctor.  Keep the sore joint raised (elevated) for the first 24 hours.  Put ice on the joint area.  Put ice in a plastic bag.  Place a towel between your skin and the bag.  Leave the ice on for 15-20 minutes, 03-04 times a day.  Wear your splint, casting, elastic bandage, or sling as told by your doctor.  Only take medicine as told by your doctor. Do not take aspirin.  Use crutches as told by your doctor. Do not put weight on the joint until told to by your doctor. GET HELP RIGHT AWAY IF:   You have bruising, puffiness (swelling), or more pain.  Your fingers or toes turn blue or start to lose feeling (numb).  Your medicine does not lessen the pain.  Your pain becomes severe.  You have a temperature by mouth above 102 F (38.9 C), not controlled by medicine.  You cannot move or use the joint. MAKE SURE YOU:   Understand these instructions.  Will watch your condition.  Will get help right away if you are not doing well or get worse. Document Released: 11/18/2009 Document Revised: 02/22/2012 Document Reviewed: 11/18/2009 [facility] Patient Information 2015 [place], [place]. This information is not intended to replace advice given to you by your health care provider. Make sure you discuss any questions you have with your health care provider. The x-ray of the hip is normal please follow-up with your primary care physician

## 2015-04-16 NOTE — ED Notes (Signed)
Patient reports she had a fall this afternoon when she tripped over something in the floor at home.  Landed on hardwood floor.  Bruising noted to left hip.  Ambulatory.
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
## 2015-06-16 ENCOUNTER — Emergency Department (HOSPITAL_COMMUNITY)
Admission: EM | Admit: 2015-06-16 | Discharge: 2015-06-16 | Disposition: A | Discharge status: Home / Self Care | Payer: Medicare Other | Attending: Emergency Medicine | Admitting: Emergency Medicine

## 2015-06-16 ENCOUNTER — Encounter (HOSPITAL_COMMUNITY): Payer: Self-pay | Admitting: Nurse Practitioner

## 2015-06-16 DIAGNOSIS — K219 Gastro-esophageal reflux disease without esophagitis: Secondary | ICD-10-CM | POA: Diagnosis not present

## 2015-06-16 DIAGNOSIS — G8929 Other chronic pain: Secondary | ICD-10-CM | POA: Insufficient documentation

## 2015-06-16 DIAGNOSIS — Z79899 Other long term (current) drug therapy: Secondary | ICD-10-CM | POA: Diagnosis not present

## 2015-06-16 DIAGNOSIS — Z880 Allergy status to penicillin: Secondary | ICD-10-CM | POA: Insufficient documentation

## 2015-06-16 DIAGNOSIS — G43909 Migraine, unspecified, not intractable, without status migrainosus: Secondary | ICD-10-CM | POA: Insufficient documentation

## 2015-06-16 DIAGNOSIS — F419 Anxiety disorder, unspecified: Secondary | ICD-10-CM | POA: Diagnosis not present

## 2015-06-16 DIAGNOSIS — Z87828 Personal history of other (healed) physical injury and trauma: Secondary | ICD-10-CM | POA: Insufficient documentation

## 2015-06-16 DIAGNOSIS — Z720 Tobacco use: Secondary | ICD-10-CM | POA: Diagnosis not present

## 2015-06-16 DIAGNOSIS — I10 Essential (primary) hypertension: Secondary | ICD-10-CM | POA: Insufficient documentation

## 2015-06-16 DIAGNOSIS — E785 Hyperlipidemia, unspecified: Secondary | ICD-10-CM | POA: Insufficient documentation

## 2015-06-16 DIAGNOSIS — M25512 Pain in left shoulder: Secondary | ICD-10-CM | POA: Diagnosis not present

## 2015-06-16 DIAGNOSIS — J449 Chronic obstructive pulmonary disease, unspecified: Secondary | ICD-10-CM | POA: Diagnosis not present

## 2015-06-16 DIAGNOSIS — E119 Type 2 diabetes mellitus without complications: Secondary | ICD-10-CM | POA: Diagnosis not present

## 2015-06-16 DIAGNOSIS — Z8701 Personal history of pneumonia (recurrent): Secondary | ICD-10-CM | POA: Diagnosis not present

## 2015-06-16 DIAGNOSIS — Z7951 Long term (current) use of inhaled steroids: Secondary | ICD-10-CM | POA: Diagnosis not present

## 2015-06-16 DIAGNOSIS — F329 Major depressive disorder, single episode, unspecified: Secondary | ICD-10-CM | POA: Insufficient documentation

## 2015-06-16 DIAGNOSIS — N3010 Interstitial cystitis (chronic) without hematuria: Secondary | ICD-10-CM | POA: Diagnosis not present

## 2015-06-16 DIAGNOSIS — M542 Cervicalgia: Secondary | ICD-10-CM | POA: Diagnosis not present

## 2015-06-16 DIAGNOSIS — M1990 Unspecified osteoarthritis, unspecified site: Secondary | ICD-10-CM | POA: Insufficient documentation

## 2015-06-16 MED ORDER — OXYCODONE-ACETAMINOPHEN 5-325 MG PO TABS: 2.0000 | ORAL_TABLET | ORAL | Status: DC | PRN

## 2015-06-16 MED ORDER — NAPROXEN 500 MG PO TABS: 500.0000 mg | ORAL_TABLET | Freq: Two times a day (BID) | ORAL | Status: DC

## 2015-06-16 MED ORDER — CYCLOBENZAPRINE HCL 10 MG PO TABS
5.0000 mg | ORAL_TABLET | Freq: Once | ORAL | Status: AC
  Administered 2015-06-16: 5 mg via ORAL
  Filled 2015-06-16: qty 1

## 2015-06-16 MED ORDER — METHOCARBAMOL 500 MG PO TABS: 500.0000 mg | ORAL_TABLET | Freq: Two times a day (BID) | ORAL | Status: DC

## 2015-06-16 MED ORDER — OXYCODONE-ACETAMINOPHEN 5-325 MG PO TABS
2.0000 | ORAL_TABLET | Freq: Once | ORAL | Status: AC
  Administered 2015-06-16: 2 via ORAL
  Filled 2015-06-16: qty 2

## 2015-06-16 NOTE — ED Notes (Signed)
Pt is c/o 7/10 neck pain nothing makes it better.

## 2015-06-16 NOTE — Discharge Instructions (Signed)
Musculoskeletal Pain Return for numbness or weakness in the upper extremities. Follow-up with orthopedics. Take naproxen for inflammation and pain. Take Percocet for breakthrough pain. Musculoskeletal pain is muscle and boney aches and pains. These pains can occur in any part of the body. Your caregiver may treat you without knowing the cause of the pain. They may treat you if blood or urine tests, X-rays, and other tests were normal.  CAUSES There is often not a definite cause or reason for these pains. These pains may be caused by a type of germ (virus). The discomfort may also come from overuse. Overuse includes working out too hard when your body is not fit. Boney aches also come from weather changes. Bone is sensitive to atmospheric pressure changes. HOME CARE INSTRUCTIONS   Ask when your test results will be ready. Make sure you get your test results.  Only take over-the-counter or prescription medicines for pain, discomfort, or fever as directed by your caregiver. If you were given medications for your condition, do not drive, operate machinery or power tools, or sign legal documents for 24 hours. Do not drink alcohol. Do not take sleeping pills or other medications that may interfere with treatment.  Continue all activities unless the activities cause more pain. When the pain lessens, slowly resume normal activities. Gradually increase the intensity and duration of the activities or exercise.  During periods of severe pain, bed rest may be helpful. Lay or sit in any position that is comfortable.  Putting ice on the injured area.  Put ice in a bag.  Place a towel between your skin and the bag.  Leave the ice on for 15 to 20 minutes, 3 to 4 times a day.  Follow up with your caregiver for continued problems and no reason can be found for the pain. If the pain becomes worse or does not go away, it may be necessary to repeat tests or do additional testing. Your caregiver may need to look  further for a possible cause. SEEK IMMEDIATE MEDICAL CARE IF:  You have pain that is getting worse and is not relieved by medications.  You develop chest pain that is associated with shortness or breath, sweating, feeling sick to your stomach (nauseous), or throw up (vomit).  Your pain becomes localized to the abdomen.  You develop any new symptoms that seem different or that concern you. MAKE SURE YOU:   Understand these instructions.  Will watch your condition.  Will get help right away if you are not doing well or get worse. Document Released: 11/30/2005 Document Revised: 02/22/2012 Document Reviewed: 08/04/2013 [facility] Patient Information 2015 [place] Southwest[place], [place]. This information is not intended to replace advice given to you by your health care provider. Make sure you discuss any questions you have with your health care provider.
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
## 2015-07-12 ENCOUNTER — Other Ambulatory Visit: Payer: Self-pay | Admitting: Neurosurgery

## 2015-07-12 DIAGNOSIS — M542 Cervicalgia: Secondary | ICD-10-CM

## 2015-07-25 ENCOUNTER — Ambulatory Visit
Admission: RE | Admit: 2015-07-25 | Discharge: 2015-07-25 | Disposition: A | Discharge status: Home / Self Care | Payer: Medicare Other | Source: Ambulatory Visit | Attending: Neurosurgery | Admitting: Neurosurgery

## 2015-07-25 DIAGNOSIS — M542 Cervicalgia: Secondary | ICD-10-CM

## 2015-08-08 ENCOUNTER — Other Ambulatory Visit: Payer: Self-pay | Admitting: Orthopedic Surgery

## 2015-08-08 DIAGNOSIS — M25512 Pain in left shoulder: Secondary | ICD-10-CM

## 2015-08-21 ENCOUNTER — Ambulatory Visit
Admission: RE | Admit: 2015-08-21 | Discharge: 2015-08-21 | Disposition: A | Discharge status: Home / Self Care | Payer: Medicare Other | Source: Ambulatory Visit | Attending: Orthopedic Surgery | Admitting: Orthopedic Surgery

## 2015-08-21 DIAGNOSIS — M25512 Pain in left shoulder: Secondary | ICD-10-CM

## 2015-09-11 ENCOUNTER — Encounter (HOSPITAL_COMMUNITY): Payer: Self-pay

## 2015-09-11 ENCOUNTER — Emergency Department (HOSPITAL_COMMUNITY): Payer: Medicare Other

## 2015-09-11 ENCOUNTER — Emergency Department (HOSPITAL_COMMUNITY)
Admission: EM | Admit: 2015-09-11 | Discharge: 2015-09-11 | Disposition: A | Discharge status: Home / Self Care | Payer: Medicare Other | Attending: Emergency Medicine | Admitting: Emergency Medicine

## 2015-09-11 DIAGNOSIS — F419 Anxiety disorder, unspecified: Secondary | ICD-10-CM | POA: Insufficient documentation

## 2015-09-11 DIAGNOSIS — E119 Type 2 diabetes mellitus without complications: Secondary | ICD-10-CM | POA: Insufficient documentation

## 2015-09-11 DIAGNOSIS — G8929 Other chronic pain: Secondary | ICD-10-CM | POA: Diagnosis not present

## 2015-09-11 DIAGNOSIS — E785 Hyperlipidemia, unspecified: Secondary | ICD-10-CM | POA: Insufficient documentation

## 2015-09-11 DIAGNOSIS — Z8701 Personal history of pneumonia (recurrent): Secondary | ICD-10-CM | POA: Insufficient documentation

## 2015-09-11 DIAGNOSIS — G43909 Migraine, unspecified, not intractable, without status migrainosus: Secondary | ICD-10-CM | POA: Insufficient documentation

## 2015-09-11 DIAGNOSIS — Z87448 Personal history of other diseases of urinary system: Secondary | ICD-10-CM | POA: Diagnosis not present

## 2015-09-11 DIAGNOSIS — Y9289 Other specified places as the place of occurrence of the external cause: Secondary | ICD-10-CM | POA: Diagnosis not present

## 2015-09-11 DIAGNOSIS — J449 Chronic obstructive pulmonary disease, unspecified: Secondary | ICD-10-CM | POA: Insufficient documentation

## 2015-09-11 DIAGNOSIS — Z720 Tobacco use: Secondary | ICD-10-CM | POA: Diagnosis not present

## 2015-09-11 DIAGNOSIS — Z791 Long term (current) use of non-steroidal anti-inflammatories (NSAID): Secondary | ICD-10-CM | POA: Insufficient documentation

## 2015-09-11 DIAGNOSIS — Z880 Allergy status to penicillin: Secondary | ICD-10-CM | POA: Insufficient documentation

## 2015-09-11 DIAGNOSIS — Z9181 History of falling: Secondary | ICD-10-CM | POA: Insufficient documentation

## 2015-09-11 DIAGNOSIS — L089 Local infection of the skin and subcutaneous tissue, unspecified: Secondary | ICD-10-CM | POA: Diagnosis not present

## 2015-09-11 DIAGNOSIS — Z8719 Personal history of other diseases of the digestive system: Secondary | ICD-10-CM | POA: Insufficient documentation

## 2015-09-11 DIAGNOSIS — Z7951 Long term (current) use of inhaled steroids: Secondary | ICD-10-CM | POA: Insufficient documentation

## 2015-09-11 DIAGNOSIS — Z79899 Other long term (current) drug therapy: Secondary | ICD-10-CM | POA: Insufficient documentation

## 2015-09-11 DIAGNOSIS — M1990 Unspecified osteoarthritis, unspecified site: Secondary | ICD-10-CM | POA: Diagnosis not present

## 2015-09-11 DIAGNOSIS — S61230A Puncture wound without foreign body of right index finger without damage to nail, initial encounter: Secondary | ICD-10-CM | POA: Insufficient documentation

## 2015-09-11 DIAGNOSIS — I10 Essential (primary) hypertension: Secondary | ICD-10-CM | POA: Diagnosis not present

## 2015-09-11 DIAGNOSIS — Y9389 Activity, other specified: Secondary | ICD-10-CM | POA: Diagnosis not present

## 2015-09-11 DIAGNOSIS — W228XXA Striking against or struck by other objects, initial encounter: Secondary | ICD-10-CM | POA: Insufficient documentation

## 2015-09-11 DIAGNOSIS — Z87828 Personal history of other (healed) physical injury and trauma: Secondary | ICD-10-CM | POA: Diagnosis not present

## 2015-09-11 DIAGNOSIS — F329 Major depressive disorder, single episode, unspecified: Secondary | ICD-10-CM | POA: Insufficient documentation

## 2015-09-11 DIAGNOSIS — Y998 Other external cause status: Secondary | ICD-10-CM | POA: Insufficient documentation

## 2015-09-11 MED ORDER — HYDROCODONE-ACETAMINOPHEN 5-325 MG PO TABS: 1.0000 | ORAL_TABLET | ORAL | Status: DC | PRN

## 2015-09-11 MED ORDER — ONDANSETRON HCL 4 MG PO TABS: 4.0000 mg | ORAL_TABLET | Freq: Four times a day (QID) | ORAL | Status: DC

## 2015-09-11 MED ORDER — HYDROCODONE-ACETAMINOPHEN 5-325 MG PO TABS
1.0000 | ORAL_TABLET | Freq: Once | ORAL | Status: AC
  Administered 2015-09-11: 1 via ORAL
  Filled 2015-09-11: qty 1

## 2015-09-11 MED ORDER — SODIUM CHLORIDE 0.9 % IV SOLN
1.5000 g | Freq: Once | INTRAVENOUS | Status: AC
  Administered 2015-09-11: 1.5 g via INTRAVENOUS
  Filled 2015-09-11: qty 1.5

## 2015-09-11 MED ORDER — AMOXICILLIN-POT CLAVULANATE 250-62.5 MG/5ML PO SUSR: 500.0000 mg | Freq: Two times a day (BID) | ORAL | Status: AC

## 2015-09-11 NOTE — ED Notes (Signed)
Pt has possible metal sliver in rt index finger.  Started swelling yesterday.
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
## 2015-09-11 NOTE — Discharge Instructions (Signed)
Please do not eat or drink anything after midnight. Go to Dr. Carollee Massed office at [facility] tomorrow at 10:30am for wound debridement. Please take your prescription as prescribed. Please return to the Emergency Department if symptoms worsen.

## 2015-09-11 NOTE — ED Notes (Signed)
Pt called for a ride.

## 2015-10-22 ENCOUNTER — Emergency Department (HOSPITAL_COMMUNITY)
Admission: EM | Admit: 2015-10-22 | Discharge: 2015-10-23 | Disposition: A | Discharge status: Home / Self Care | Payer: Medicare Other | Attending: Emergency Medicine | Admitting: Emergency Medicine

## 2015-10-22 ENCOUNTER — Encounter (HOSPITAL_COMMUNITY): Payer: Self-pay | Admitting: Emergency Medicine

## 2015-10-22 ENCOUNTER — Emergency Department (HOSPITAL_COMMUNITY): Payer: Medicare Other

## 2015-10-22 DIAGNOSIS — Z79899 Other long term (current) drug therapy: Secondary | ICD-10-CM | POA: Insufficient documentation

## 2015-10-22 DIAGNOSIS — E785 Hyperlipidemia, unspecified: Secondary | ICD-10-CM | POA: Diagnosis not present

## 2015-10-22 DIAGNOSIS — Y9389 Activity, other specified: Secondary | ICD-10-CM | POA: Insufficient documentation

## 2015-10-22 DIAGNOSIS — S62633B Displaced fracture of distal phalanx of left middle finger, initial encounter for open fracture: Secondary | ICD-10-CM | POA: Insufficient documentation

## 2015-10-22 DIAGNOSIS — M1990 Unspecified osteoarthritis, unspecified site: Secondary | ICD-10-CM | POA: Diagnosis not present

## 2015-10-22 DIAGNOSIS — Y998 Other external cause status: Secondary | ICD-10-CM | POA: Diagnosis not present

## 2015-10-22 DIAGNOSIS — K219 Gastro-esophageal reflux disease without esophagitis: Secondary | ICD-10-CM | POA: Insufficient documentation

## 2015-10-22 DIAGNOSIS — Z720 Tobacco use: Secondary | ICD-10-CM | POA: Diagnosis not present

## 2015-10-22 DIAGNOSIS — Y9289 Other specified places as the place of occurrence of the external cause: Secondary | ICD-10-CM | POA: Insufficient documentation

## 2015-10-22 DIAGNOSIS — I10 Essential (primary) hypertension: Secondary | ICD-10-CM | POA: Insufficient documentation

## 2015-10-22 DIAGNOSIS — F329 Major depressive disorder, single episode, unspecified: Secondary | ICD-10-CM | POA: Insufficient documentation

## 2015-10-22 DIAGNOSIS — Z8701 Personal history of pneumonia (recurrent): Secondary | ICD-10-CM | POA: Insufficient documentation

## 2015-10-22 DIAGNOSIS — Z7901 Long term (current) use of anticoagulants: Secondary | ICD-10-CM | POA: Diagnosis not present

## 2015-10-22 DIAGNOSIS — F419 Anxiety disorder, unspecified: Secondary | ICD-10-CM | POA: Diagnosis not present

## 2015-10-22 DIAGNOSIS — E119 Type 2 diabetes mellitus without complications: Secondary | ICD-10-CM | POA: Insufficient documentation

## 2015-10-22 DIAGNOSIS — N3010 Interstitial cystitis (chronic) without hematuria: Secondary | ICD-10-CM | POA: Diagnosis not present

## 2015-10-22 DIAGNOSIS — S6992XA Unspecified injury of left wrist, hand and finger(s), initial encounter: Secondary | ICD-10-CM | POA: Diagnosis present

## 2015-10-22 DIAGNOSIS — Z880 Allergy status to penicillin: Secondary | ICD-10-CM | POA: Insufficient documentation

## 2015-10-22 DIAGNOSIS — G43909 Migraine, unspecified, not intractable, without status migrainosus: Secondary | ICD-10-CM | POA: Diagnosis not present

## 2015-10-22 DIAGNOSIS — W540XXA Bitten by dog, initial encounter: Secondary | ICD-10-CM | POA: Diagnosis not present

## 2015-10-22 DIAGNOSIS — G8929 Other chronic pain: Secondary | ICD-10-CM | POA: Insufficient documentation

## 2015-10-22 DIAGNOSIS — IMO0002 Reserved for concepts with insufficient information to code with codable children: Secondary | ICD-10-CM

## 2015-10-22 DIAGNOSIS — L03012 Cellulitis of left finger: Secondary | ICD-10-CM | POA: Diagnosis not present

## 2015-10-22 DIAGNOSIS — J449 Chronic obstructive pulmonary disease, unspecified: Secondary | ICD-10-CM | POA: Diagnosis not present

## 2015-10-22 DIAGNOSIS — Z7951 Long term (current) use of inhaled steroids: Secondary | ICD-10-CM | POA: Insufficient documentation

## 2015-10-22 LAB — CBC
HEMATOCRIT: 39.6 % (ref 36.0–46.0)
HEMOGLOBIN: 13.3 g/dL (ref 12.0–15.0)
MCH: 30.8 pg (ref 26.0–34.0)
MCHC: 33.6 g/dL (ref 30.0–36.0)
MCV: 91.7 fL (ref 78.0–100.0)
Platelets: 363 10*3/uL (ref 150–400)
RBC: 4.32 MIL/uL (ref 3.87–5.11)
RDW: 13.1 % (ref 11.5–15.5)
WBC: 10.6 10*3/uL — ABNORMAL HIGH (ref 4.0–10.5)

## 2015-10-22 LAB — COMPREHENSIVE METABOLIC PANEL
ALBUMIN: 3.9 g/dL (ref 3.5–5.0)
ALK PHOS: 77 U/L (ref 38–126)
ALT: 17 U/L (ref 14–54)
ANION GAP: 6 (ref 5–15)
AST: 14 U/L — ABNORMAL LOW (ref 15–41)
BILIRUBIN TOTAL: 0.7 mg/dL (ref 0.3–1.2)
BUN: 17 mg/dL (ref 6–20)
CALCIUM: 9.7 mg/dL (ref 8.9–10.3)
CO2: 27 mmol/L (ref 22–32)
Chloride: 105 mmol/L (ref 101–111)
Creatinine, Ser: 0.86 mg/dL (ref 0.44–1.00)
GFR calc non Af Amer: >60 mL/min (ref >60)
GLUCOSE: 98 mg/dL (ref 65–99)
POTASSIUM: 4.7 mmol/L (ref 3.5–5.1)
SODIUM: 138 mmol/L (ref 135–145)
TOTAL PROTEIN: 7.5 g/dL (ref 6.5–8.1)

## 2015-10-22 MED ORDER — OXYCODONE-ACETAMINOPHEN 5-325 MG PO TABS
1.0000 | ORAL_TABLET | Freq: Once | ORAL | Status: AC
  Administered 2015-10-22: 1 via ORAL
  Filled 2015-10-22: qty 1

## 2015-10-22 NOTE — ED Notes (Signed)
Pt thinks her finger slipped into dogs mouth about 2 days ago. Pt has swelling to left middle finger. Pt finger appears to be infected.
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
## 2015-10-23 DIAGNOSIS — S62633B Displaced fracture of distal phalanx of left middle finger, initial encounter for open fracture: Secondary | ICD-10-CM | POA: Diagnosis not present

## 2015-10-23 MED ORDER — SULFAMETHOXAZOLE-TRIMETHOPRIM 800-160 MG PO TABS: 1.0000 | ORAL_TABLET | Freq: Two times a day (BID) | ORAL | Status: AC

## 2015-10-23 MED ORDER — OXYCODONE-ACETAMINOPHEN 5-325 MG PO TABS: 1.0000 | ORAL_TABLET | Freq: Four times a day (QID) | ORAL | Status: DC | PRN

## 2015-10-23 MED ORDER — BUPIVACAINE HCL 0.25 % IJ SOLN
10.0000 mL | Freq: Once | INTRAMUSCULAR | Status: DC
  Filled 2015-10-23: qty 10

## 2015-10-23 MED ORDER — OXYCODONE-ACETAMINOPHEN 5-325 MG PO TABS
1.0000 | ORAL_TABLET | Freq: Once | ORAL | Status: AC
  Administered 2015-10-23: 1 via ORAL
  Filled 2015-10-23: qty 1

## 2015-10-23 MED ORDER — BUPIVACAINE HCL (PF) 0.25 % IJ SOLN
INTRAMUSCULAR | Status: AC
  Administered 2015-10-23: 30 mL
  Filled 2015-10-23: qty 30

## 2015-10-23 MED ORDER — METRONIDAZOLE 500 MG PO TABS: 500.0000 mg | ORAL_TABLET | Freq: Three times a day (TID) | ORAL | Status: DC

## 2015-10-23 MED ORDER — LIDOCAINE HCL 1 % IJ SOLN
10.0000 mL | Freq: Once | INTRAMUSCULAR | Status: AC
  Administered 2015-10-23: 10 mL via INTRADERMAL
  Filled 2015-10-23: qty 20
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
## 2015-10-23 NOTE — ED Notes (Signed)
Hand surgeon at the bedside. 

## 2015-10-23 NOTE — ED Notes (Signed)
Bed: WA05 Expected date:  Expected time:  Means of arrival:  Comments: 

## 2015-10-23 NOTE — Discharge Instructions (Signed)
-   follow up with Dr. Merlyn LotKuzma in his office on Friday   Fingertip Infection When an infection is around the nail, it is called a paronychia. When it appears over the tip of the finger, it is called a felon. These infections are due to minor injuries or cracks in the skin. If they are not treated properly, they can lead to bone infection and permanent damage to the fingernail. Incision and drainage is necessary if a pus pocket (an abscess) has formed. Antibiotics and pain medicine may also be needed. Keep your hand elevated for the next 2-3 days to reduce swelling and pain. If a pack was placed in the abscess, it should be removed in 1-2 days by your caregiver. Soak the finger in warm water for 20 minutes 4 times daily to help promote drainage. Keep the hands as dry as possible. Wear protective gloves with cotton liners. See your caregiver for follow-up care as recommended.  HOME CARE INSTRUCTIONS   Keep wound clean, dry and dressed as suggested by your caregiver.  Soak in warm salt water for fifteen minutes, four times per day for bacterial infections.  Your caregiver will prescribe an antibiotic if a bacterial infection is suspected. Take antibiotics as directed and finish the prescription, even if the problem appears to be improving before the medicine is gone.  Only take over-the-counter or prescription medicines for pain, discomfort, or fever as directed by your caregiver. SEEK IMMEDIATE MEDICAL CARE IF:  There is redness, swelling, or increasing pain in the wound.  Pus or any other unusual drainage is coming from the wound.  An unexplained oral temperature above 102 F (38.9 C) develops.  You notice a foul smell coming from the wound or dressing. MAKE SURE YOU:   Understand these instructions.  Monitor your condition.  Contact your caregiver if you are getting worse or not improving.   This information is not intended to replace advice given to you by your health care provider.  Make sure you discuss any questions you have with your health care provider.   Document Released: 01/07/2005 Document Revised: 02/22/2012 Document Reviewed: 05/20/2015 Elsevier Interactive Patient Education [employer]2016 Elsevier Inc.

## 2015-10-25 LAB — WOUND CULTURE
Gram Stain: NONE SEEN
Special Requests: NORMAL

## 2015-10-26 ENCOUNTER — Telehealth (HOSPITAL_BASED_OUTPATIENT_CLINIC_OR_DEPARTMENT_OTHER): Payer: Self-pay | Admitting: Emergency Medicine

## 2015-10-26 NOTE — Telephone Encounter (Signed)
Post ED Visit - Positive Culture Follow-up  Culture report reviewed by antimicrobial stewardship pharmacist:  []  Enzo BiNathan Batchelder, Pharm.D. []  Celedonio MiyamotoJeremy Frens, Pharm.D., BCPS []  Garvin FilaMike Maccia, Pharm.D. []  Georgina PillionElizabeth Martin, Pharm.D., BCPS [x]  DoraMinh Pham, VermontPharm.D., BCPS, AAHIVP []  Estella HuskMichelle Turner, Pharm.D., BCPS, AAHIVP []  Tennis Mustassie Stewart, Pharm.D. []  Sherle Poeob Vincent, [address]Pharm[address]  Positive wound culture  Staph Treated with bactrim DS and flagyl, organism sensitive to the same and no further patient follow-up is required at this time.  Berle MullMiller, Ellizabeth Dacruz 10/26/2015, 2:59 PM

## 2017-02-02 ENCOUNTER — Emergency Department (HOSPITAL_COMMUNITY)
Admission: EM | Admit: 2017-02-02 | Discharge: 2017-02-02 | Disposition: A | Discharge status: Home / Self Care | Payer: Medicare Other | Attending: Emergency Medicine | Admitting: Emergency Medicine

## 2017-02-02 ENCOUNTER — Encounter (HOSPITAL_COMMUNITY): Payer: Self-pay | Admitting: Emergency Medicine

## 2017-02-02 DIAGNOSIS — Z79899 Other long term (current) drug therapy: Secondary | ICD-10-CM | POA: Insufficient documentation

## 2017-02-02 DIAGNOSIS — J449 Chronic obstructive pulmonary disease, unspecified: Secondary | ICD-10-CM | POA: Insufficient documentation

## 2017-02-02 DIAGNOSIS — Y939 Activity, unspecified: Secondary | ICD-10-CM | POA: Insufficient documentation

## 2017-02-02 DIAGNOSIS — Z7984 Long term (current) use of oral hypoglycemic drugs: Secondary | ICD-10-CM | POA: Insufficient documentation

## 2017-02-02 DIAGNOSIS — S0181XA Laceration without foreign body of other part of head, initial encounter: Secondary | ICD-10-CM | POA: Diagnosis not present

## 2017-02-02 DIAGNOSIS — F17210 Nicotine dependence, cigarettes, uncomplicated: Secondary | ICD-10-CM | POA: Insufficient documentation

## 2017-02-02 DIAGNOSIS — I10 Essential (primary) hypertension: Secondary | ICD-10-CM | POA: Insufficient documentation

## 2017-02-02 DIAGNOSIS — Z23 Encounter for immunization: Secondary | ICD-10-CM | POA: Diagnosis not present

## 2017-02-02 DIAGNOSIS — W228XXA Striking against or struck by other objects, initial encounter: Secondary | ICD-10-CM | POA: Diagnosis not present

## 2017-02-02 DIAGNOSIS — Y999 Unspecified external cause status: Secondary | ICD-10-CM | POA: Diagnosis not present

## 2017-02-02 DIAGNOSIS — S0993XA Unspecified injury of face, initial encounter: Secondary | ICD-10-CM | POA: Diagnosis present

## 2017-02-02 DIAGNOSIS — E119 Type 2 diabetes mellitus without complications: Secondary | ICD-10-CM | POA: Diagnosis not present

## 2017-02-02 DIAGNOSIS — Y929 Unspecified place or not applicable: Secondary | ICD-10-CM | POA: Diagnosis not present

## 2017-02-02 MED ORDER — LIDOCAINE-EPINEPHRINE (PF) 2 %-1:200000 IJ SOLN
10.0000 mL | Freq: Once | INTRAMUSCULAR | Status: AC
  Administered 2017-02-02: 10 mL via INTRADERMAL
  Filled 2017-02-02: qty 20

## 2017-02-02 MED ORDER — TETANUS-DIPHTH-ACELL PERTUSSIS 5-2.5-18.5 LF-MCG/0.5 IM SUSP
0.5000 mL | Freq: Once | INTRAMUSCULAR | Status: AC
  Administered 2017-02-02: 0.5 mL via INTRAMUSCULAR
  Filled 2017-02-02: qty 0.5

## 2017-02-02 MED ORDER — HYDROCODONE-ACETAMINOPHEN 5-325 MG PO TABS: 1.0000 | ORAL_TABLET | Freq: Four times a day (QID) | ORAL | 0 refills | Status: DC | PRN

## 2017-02-02 MED ORDER — LORAZEPAM 1 MG PO TABS
1.0000 mg | ORAL_TABLET | Freq: Once | ORAL | Status: AC
  Administered 2017-02-02: 1 mg via ORAL
  Filled 2017-02-02: qty 1
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
## 2017-02-02 NOTE — Discharge Instructions (Addendum)
Keep wound clean and dry - clean with soap and water Change bandage daily Remove stitches in 5 days Do not drive while taking prescription pain medicine Return for worsening symptoms

## 2017-02-02 NOTE — ED Notes (Signed)
Pt ambulatory and independent at discharge.  Verbalized understanding of discharge instructions and importance of not driving while under the influence of pain medication. 

## 2017-02-02 NOTE — ED Triage Notes (Signed)
Pt comes from home via EMS with complaints of a laceration to the right temple region.  Hit her head on a nail on her dresser.  Ambulatory to triage. Bleeding controlled at this time.  Denies blood thinners.  A&O x4.

## 2017-04-29 ENCOUNTER — Emergency Department (HOSPITAL_COMMUNITY)
Admission: EM | Admit: 2017-04-29 | Discharge: 2017-04-30 | Disposition: A | Discharge status: Home / Self Care | Payer: Medicare Other | Attending: Emergency Medicine | Admitting: Emergency Medicine

## 2017-04-29 ENCOUNTER — Encounter (HOSPITAL_COMMUNITY): Payer: Self-pay | Admitting: Emergency Medicine

## 2017-04-29 DIAGNOSIS — Y999 Unspecified external cause status: Secondary | ICD-10-CM | POA: Diagnosis not present

## 2017-04-29 DIAGNOSIS — S9001XA Contusion of right ankle, initial encounter: Secondary | ICD-10-CM | POA: Diagnosis not present

## 2017-04-29 DIAGNOSIS — F17210 Nicotine dependence, cigarettes, uncomplicated: Secondary | ICD-10-CM | POA: Insufficient documentation

## 2017-04-29 DIAGNOSIS — S99911A Unspecified injury of right ankle, initial encounter: Secondary | ICD-10-CM | POA: Diagnosis present

## 2017-04-29 DIAGNOSIS — I10 Essential (primary) hypertension: Secondary | ICD-10-CM | POA: Insufficient documentation

## 2017-04-29 DIAGNOSIS — E119 Type 2 diabetes mellitus without complications: Secondary | ICD-10-CM | POA: Insufficient documentation

## 2017-04-29 DIAGNOSIS — J449 Chronic obstructive pulmonary disease, unspecified: Secondary | ICD-10-CM | POA: Diagnosis not present

## 2017-04-29 DIAGNOSIS — S80211A Abrasion, right knee, initial encounter: Secondary | ICD-10-CM | POA: Diagnosis not present

## 2017-04-29 DIAGNOSIS — R58 Hemorrhage, not elsewhere classified: Secondary | ICD-10-CM

## 2017-04-29 DIAGNOSIS — R202 Paresthesia of skin: Secondary | ICD-10-CM | POA: Diagnosis not present

## 2017-04-29 DIAGNOSIS — Y929 Unspecified place or not applicable: Secondary | ICD-10-CM | POA: Insufficient documentation

## 2017-04-29 DIAGNOSIS — Z7984 Long term (current) use of oral hypoglycemic drugs: Secondary | ICD-10-CM | POA: Diagnosis not present

## 2017-04-29 DIAGNOSIS — X58XXXA Exposure to other specified factors, initial encounter: Secondary | ICD-10-CM | POA: Diagnosis not present

## 2017-04-29 DIAGNOSIS — Y939 Activity, unspecified: Secondary | ICD-10-CM | POA: Diagnosis not present

## 2017-04-29 LAB — CBG MONITORING, ED: GLUCOSE-CAPILLARY: 93 mg/dL (ref 65–99)

## 2017-04-29 NOTE — ED Triage Notes (Signed)
Patient in from home with complaints of right foot numbness and tingling. Denies injury. Hx diabetes.
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
## 2017-04-30 ENCOUNTER — Emergency Department (HOSPITAL_COMMUNITY): Payer: Medicare Other

## 2017-04-30 ENCOUNTER — Encounter (HOSPITAL_COMMUNITY): Payer: Self-pay | Admitting: Emergency Medicine

## 2017-04-30 DIAGNOSIS — S9001XA Contusion of right ankle, initial encounter: Secondary | ICD-10-CM | POA: Diagnosis not present

## 2017-04-30 LAB — I-STAT CHEM 8, ED
BUN: 10 mg/dL (ref 6–20)
CALCIUM ION: 1.23 mmol/L (ref 1.15–1.40)
Chloride: 102 mmol/L (ref 101–111)
Creatinine, Ser: 1 mg/dL (ref 0.44–1.00)
Glucose, Bld: 90 mg/dL (ref 65–99)
HEMATOCRIT: 37 % (ref 36.0–46.0)
HEMOGLOBIN: 12.6 g/dL (ref 12.0–15.0)
Potassium: 4 mmol/L (ref 3.5–5.1)
SODIUM: 138 mmol/L (ref 135–145)
TCO2: 26 mmol/L (ref 0–100)

## 2017-04-30 MED ORDER — IBUPROFEN 800 MG PO TABS
800.0000 mg | ORAL_TABLET | Freq: Once | ORAL | Status: AC
  Administered 2017-04-30: 800 mg via ORAL
  Filled 2017-04-30: qty 1

## 2017-04-30 MED ORDER — IBUPROFEN 800 MG PO TABS: 800.0000 mg | ORAL_TABLET | Freq: Three times a day (TID) | ORAL | 0 refills | Status: DC

## 2017-08-13 ENCOUNTER — Encounter (HOSPITAL_COMMUNITY): Payer: Self-pay | Admitting: Emergency Medicine

## 2017-08-13 ENCOUNTER — Emergency Department (HOSPITAL_COMMUNITY)
Admission: EM | Admit: 2017-08-13 | Discharge: 2017-08-13 | Disposition: A | Discharge status: Home / Self Care | Payer: Medicare Other | Attending: Emergency Medicine | Admitting: Emergency Medicine

## 2017-08-13 ENCOUNTER — Emergency Department (HOSPITAL_COMMUNITY): Payer: Medicare Other

## 2017-08-13 DIAGNOSIS — R51 Headache: Secondary | ICD-10-CM | POA: Diagnosis present

## 2017-08-13 DIAGNOSIS — R0789 Other chest pain: Secondary | ICD-10-CM | POA: Insufficient documentation

## 2017-08-13 DIAGNOSIS — I10 Essential (primary) hypertension: Secondary | ICD-10-CM | POA: Insufficient documentation

## 2017-08-13 DIAGNOSIS — Y939 Activity, unspecified: Secondary | ICD-10-CM | POA: Insufficient documentation

## 2017-08-13 DIAGNOSIS — Y92513 Shop (commercial) as the place of occurrence of the external cause: Secondary | ICD-10-CM | POA: Insufficient documentation

## 2017-08-13 DIAGNOSIS — Z7984 Long term (current) use of oral hypoglycemic drugs: Secondary | ICD-10-CM | POA: Insufficient documentation

## 2017-08-13 DIAGNOSIS — W01198A Fall on same level from slipping, tripping and stumbling with subsequent striking against other object, initial encounter: Secondary | ICD-10-CM | POA: Diagnosis not present

## 2017-08-13 DIAGNOSIS — E119 Type 2 diabetes mellitus without complications: Secondary | ICD-10-CM | POA: Insufficient documentation

## 2017-08-13 DIAGNOSIS — M25511 Pain in right shoulder: Secondary | ICD-10-CM | POA: Insufficient documentation

## 2017-08-13 DIAGNOSIS — W19XXXA Unspecified fall, initial encounter: Secondary | ICD-10-CM

## 2017-08-13 DIAGNOSIS — Z880 Allergy status to penicillin: Secondary | ICD-10-CM | POA: Diagnosis not present

## 2017-08-13 DIAGNOSIS — F17210 Nicotine dependence, cigarettes, uncomplicated: Secondary | ICD-10-CM | POA: Diagnosis not present

## 2017-08-13 DIAGNOSIS — Y998 Other external cause status: Secondary | ICD-10-CM | POA: Diagnosis not present

## 2017-08-13 DIAGNOSIS — J449 Chronic obstructive pulmonary disease, unspecified: Secondary | ICD-10-CM | POA: Insufficient documentation

## 2017-08-13 DIAGNOSIS — M25521 Pain in right elbow: Secondary | ICD-10-CM | POA: Insufficient documentation

## 2017-08-13 MED ORDER — ACETAMINOPHEN 500 MG PO TABS: 500.0000 mg | ORAL_TABLET | Freq: Four times a day (QID) | ORAL | 0 refills | Status: DC | PRN

## 2017-08-13 MED ORDER — ACETAMINOPHEN 325 MG PO TABS
650.0000 mg | ORAL_TABLET | Freq: Once | ORAL | Status: AC
  Administered 2017-08-13: 650 mg via ORAL
  Filled 2017-08-13: qty 2
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
## 2017-08-13 NOTE — Discharge Instructions (Signed)
Please see the information and instructions below regarding your visit.  Your diagnoses today include:  1. Fall, initial encounter    Your exam and imaging today were reassuring suggest that you do not have any fractures or acute head injury.  Tests performed today include: See side panel of your discharge paperwork for testing performed today. Vital signs are listed at the bottom of these instructions.   Medications prescribed:    1. Tylenol. Please take 650 mg every 6 hours as needed for 1 week for your pain.  Take any prescribed medications only as prescribed, and any over the counter medications only as directed on the packaging.  Home care instructions:  Please follow any educational materials contained in this packet.   Your symptoms should resolve steadily over several days at this time. Follow instructions below for relieving pain.   Put ice on the injured area.   Place a towel between your skin and the bag of ice.   Leave the ice on for 15 to 20 minutes, 3 to 4 times a day. This will help with pain in your bones and joints.   Drink enough fluids to keep your urine clear or pale yellow. Hydration will help prevent muscle spasms. Do not drink alcohol.   Take a warm shower or bath once or twice a day. This will increase blood flow to sore muscles.   Be careful when lifting, as this may aggravate neck or back pain.   Only take over-the-counter or prescription medicines for pain, discomfort, or fever as directed by your caregiver. Do not use aspirin. This may increase bruising and bleeding.  Follow any educational materials contained in this packet. The worst pain and soreness will be 24-48 hours after the accident.   Follow-up instructions: Please follow-up with your primary care provider in one week for further evaluation of your symptoms if they are not completely improved.   Return instructions:   Please return to the Emergency Department if you experience worsening  symptoms.   Please return if you experience increasing pain, headache not relieved by medicine, vomiting, vision or hearing changes, confusion, numbness or tingling in your arms or legs, severe pain in your neck, especially along the midline, changes in bowel or bladder control, chest pain, increasing abdominal discomfort, or if you feel it is necessary for any reason.   Please return if you have any other emergent concerns.  Your vital signs today were: BP (!) 161/95 (BP Location: Left Arm)    Pulse 74    Temp 98.2 F (36.8 C) (Oral)    Resp 15    SpO2 98%  If your blood pressure (BP) was elevated on multiple readings during this visit above 130 for the top number or above 80 for the bottom number, please have this repeated by your primary care provider within one month. --------------  Thank you for allowing us to participate in your care today.

## 2017-08-13 NOTE — ED Triage Notes (Signed)
Pt c/o chin and facial swelling after trip and fall onset concrete 2 days ago, struck anterior chest and face on concrete. No LOC, blood thinners. Point tenderness to right foot, right anterior shoulder, left elbow.

## 2017-11-08 ENCOUNTER — Emergency Department (HOSPITAL_COMMUNITY): Payer: Medicare Other

## 2017-11-08 ENCOUNTER — Emergency Department (HOSPITAL_COMMUNITY)
Admission: EM | Admit: 2017-11-08 | Discharge: 2017-11-08 | Disposition: A | Discharge status: Home / Self Care | Payer: Medicare Other | Attending: Emergency Medicine | Admitting: Emergency Medicine

## 2017-11-08 ENCOUNTER — Encounter (HOSPITAL_COMMUNITY): Payer: Self-pay | Admitting: Emergency Medicine

## 2017-11-08 DIAGNOSIS — J449 Chronic obstructive pulmonary disease, unspecified: Secondary | ICD-10-CM | POA: Diagnosis not present

## 2017-11-08 DIAGNOSIS — E119 Type 2 diabetes mellitus without complications: Secondary | ICD-10-CM | POA: Diagnosis not present

## 2017-11-08 DIAGNOSIS — Z7984 Long term (current) use of oral hypoglycemic drugs: Secondary | ICD-10-CM | POA: Diagnosis not present

## 2017-11-08 DIAGNOSIS — I10 Essential (primary) hypertension: Secondary | ICD-10-CM | POA: Diagnosis not present

## 2017-11-08 DIAGNOSIS — M542 Cervicalgia: Secondary | ICD-10-CM | POA: Insufficient documentation

## 2017-11-08 DIAGNOSIS — M25512 Pain in left shoulder: Secondary | ICD-10-CM | POA: Insufficient documentation

## 2017-11-08 DIAGNOSIS — F17210 Nicotine dependence, cigarettes, uncomplicated: Secondary | ICD-10-CM | POA: Insufficient documentation

## 2017-11-08 DIAGNOSIS — G8929 Other chronic pain: Secondary | ICD-10-CM | POA: Insufficient documentation

## 2017-11-08 DIAGNOSIS — Z79899 Other long term (current) drug therapy: Secondary | ICD-10-CM | POA: Insufficient documentation

## 2017-11-08 MED ORDER — OXYCODONE-ACETAMINOPHEN 5-325 MG PO TABS
1.0000 | ORAL_TABLET | Freq: Once | ORAL | Status: AC
  Administered 2017-11-08: 1 via ORAL
  Filled 2017-11-08: qty 1

## 2017-11-08 MED ORDER — CYCLOBENZAPRINE HCL 10 MG PO TABS: 10.0000 mg | ORAL_TABLET | Freq: Two times a day (BID) | ORAL | 0 refills | Status: DC | PRN

## 2017-11-08 MED ORDER — DICLOFENAC SODIUM 50 MG PO TBEC: 50.0000 mg | DELAYED_RELEASE_TABLET | Freq: Two times a day (BID) | ORAL | 0 refills | Status: DC

## 2017-11-08 MED ORDER — CYCLOBENZAPRINE HCL 10 MG PO TABS
10.0000 mg | ORAL_TABLET | Freq: Once | ORAL | Status: AC
  Administered 2017-11-08: 10 mg via ORAL
  Filled 2017-11-08: qty 1

## 2017-11-08 NOTE — Discharge Instructions (Signed)
The x-ray of your neck and shoulder do not show any new injury. We are treating your pain and muscle spasm. Call your doctor in the morning for follow and any additional pain management.

## 2017-11-08 NOTE — ED Notes (Signed)
Pt ambulatory and independent at discharge.  Verbalized understanding of discharge instructions 
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
## 2017-11-08 NOTE — ED Triage Notes (Signed)
Patient c/o left shoulder pain x3 days. Denies injury. Reports pain increases with movement. Hx reconstructive surgery to left shoulder.

## 2017-11-08 NOTE — ED Notes (Signed)
Pt refused vitals at discharge. Stated she just wanted to go home and take her sleeping medication and sleep the pain off.

## 2017-11-19 ENCOUNTER — Other Ambulatory Visit: Payer: Self-pay | Admitting: Orthopedic Surgery

## 2017-11-19 DIAGNOSIS — M4722 Other spondylosis with radiculopathy, cervical region: Secondary | ICD-10-CM

## 2017-12-09 ENCOUNTER — Ambulatory Visit
Admission: RE | Admit: 2017-12-09 | Discharge: 2017-12-09 | Disposition: A | Discharge status: Home / Self Care | Payer: Medicare Other | Source: Ambulatory Visit | Attending: Orthopedic Surgery | Admitting: Orthopedic Surgery

## 2017-12-09 DIAGNOSIS — M4722 Other spondylosis with radiculopathy, cervical region: Secondary | ICD-10-CM

## 2017-12-30 ENCOUNTER — Other Ambulatory Visit: Payer: Self-pay | Admitting: Orthopedic Surgery

## 2017-12-30 DIAGNOSIS — M542 Cervicalgia: Secondary | ICD-10-CM

## 2018-01-03 ENCOUNTER — Ambulatory Visit
Admission: RE | Admit: 2018-01-03 | Discharge: 2018-01-03 | Disposition: A | Discharge status: Home / Self Care | Payer: Medicare Other | Source: Ambulatory Visit | Attending: Orthopedic Surgery | Admitting: Orthopedic Surgery

## 2018-01-03 DIAGNOSIS — M542 Cervicalgia: Secondary | ICD-10-CM

## 2018-01-20 ENCOUNTER — Other Ambulatory Visit: Payer: Self-pay | Admitting: Orthopedic Surgery

## 2018-01-24 ENCOUNTER — Other Ambulatory Visit: Payer: Self-pay

## 2018-01-24 ENCOUNTER — Encounter (HOSPITAL_COMMUNITY): Payer: Self-pay

## 2018-01-24 NOTE — Progress Notes (Signed)
PCP - Dr. Dareen PianoAnderson  Cardiologist - Denies  Chest x-ray - 01/26/18  EKG - 01/26/18  Stress Test - Denies  ECHO - Denies  Cardiac Cath - Denies  Sleep Study - No CPAP - None  LABS- 01/26/18: CBC w/D, CMP,PT,PTT,T/S, UA  HA1C- 12/2017- Pt to bring results from home Fasting Blood Sugar - 90 Checks Blood Sugar ___2__ times a day  Anesthesia- No  Pt denies having chest pain, sob, or fever during pre-op phone call. All instructions explained to the pt, with a verbal understanding including as of today, stop taking all Aspirins, Vitamins, Fish oils, and Herbal medications. Also stop all NSAIDS i.e. Advil, Ibuprofen, Motrin, Aleve, Anaprox, Naproxen, BC and Goody Powders. Pt also given information on the Diabetic pre-surgery guidelines.The opportunity to ask questions was provided.

## 2018-01-26 ENCOUNTER — Encounter (HOSPITAL_COMMUNITY)
Admission: RE | Disposition: A | Discharge status: Home / Self Care | Payer: Self-pay | Source: Ambulatory Visit | Attending: Orthopedic Surgery

## 2018-01-26 ENCOUNTER — Inpatient Hospital Stay (HOSPITAL_COMMUNITY)
Admission: RE | Admit: 2018-01-26 | Discharge: 2018-01-31 | DRG: 453 | Disposition: A | Discharge status: Home / Self Care | Payer: Medicare Other | Source: Ambulatory Visit | Attending: Orthopedic Surgery | Admitting: Orthopedic Surgery

## 2018-01-26 ENCOUNTER — Inpatient Hospital Stay (HOSPITAL_COMMUNITY): Payer: Medicare Other | Admitting: Anesthesiology

## 2018-01-26 ENCOUNTER — Inpatient Hospital Stay (HOSPITAL_COMMUNITY): Payer: Medicare Other

## 2018-01-26 ENCOUNTER — Encounter (HOSPITAL_COMMUNITY): Payer: Self-pay | Admitting: *Deleted

## 2018-01-26 ENCOUNTER — Other Ambulatory Visit: Payer: Self-pay

## 2018-01-26 DIAGNOSIS — E119 Type 2 diabetes mellitus without complications: Secondary | ICD-10-CM | POA: Diagnosis present

## 2018-01-26 DIAGNOSIS — M4802 Spinal stenosis, cervical region: Secondary | ICD-10-CM | POA: Diagnosis present

## 2018-01-26 DIAGNOSIS — R0902 Hypoxemia: Secondary | ICD-10-CM | POA: Diagnosis not present

## 2018-01-26 DIAGNOSIS — E785 Hyperlipidemia, unspecified: Secondary | ICD-10-CM | POA: Diagnosis present

## 2018-01-26 DIAGNOSIS — Y838 Other surgical procedures as the cause of abnormal reaction of the patient, or of later complication, without mention of misadventure at the time of the procedure: Secondary | ICD-10-CM | POA: Diagnosis present

## 2018-01-26 DIAGNOSIS — J690 Pneumonitis due to inhalation of food and vomit: Secondary | ICD-10-CM | POA: Diagnosis not present

## 2018-01-26 DIAGNOSIS — I10 Essential (primary) hypertension: Secondary | ICD-10-CM | POA: Diagnosis present

## 2018-01-26 DIAGNOSIS — M960 Pseudarthrosis after fusion or arthrodesis: Secondary | ICD-10-CM | POA: Diagnosis present

## 2018-01-26 DIAGNOSIS — J9601 Acute respiratory failure with hypoxia: Secondary | ICD-10-CM | POA: Diagnosis not present

## 2018-01-26 DIAGNOSIS — T4275XA Adverse effect of unspecified antiepileptic and sedative-hypnotic drugs, initial encounter: Secondary | ICD-10-CM | POA: Diagnosis not present

## 2018-01-26 DIAGNOSIS — R000 Tachycardia, unspecified: Secondary | ICD-10-CM | POA: Diagnosis not present

## 2018-01-26 DIAGNOSIS — M160 Bilateral primary osteoarthritis of hip: Secondary | ICD-10-CM | POA: Diagnosis present

## 2018-01-26 DIAGNOSIS — Z7951 Long term (current) use of inhaled steroids: Secondary | ICD-10-CM | POA: Diagnosis not present

## 2018-01-26 DIAGNOSIS — Z7984 Long term (current) use of oral hypoglycemic drugs: Secondary | ICD-10-CM

## 2018-01-26 DIAGNOSIS — F17210 Nicotine dependence, cigarettes, uncomplicated: Secondary | ICD-10-CM | POA: Diagnosis present

## 2018-01-26 DIAGNOSIS — F4310 Post-traumatic stress disorder, unspecified: Secondary | ICD-10-CM | POA: Diagnosis present

## 2018-01-26 DIAGNOSIS — M5412 Radiculopathy, cervical region: Secondary | ICD-10-CM | POA: Diagnosis present

## 2018-01-26 DIAGNOSIS — Z01818 Encounter for other preprocedural examination: Secondary | ICD-10-CM

## 2018-01-26 DIAGNOSIS — Z90710 Acquired absence of both cervix and uterus: Secondary | ICD-10-CM | POA: Diagnosis not present

## 2018-01-26 DIAGNOSIS — R0781 Pleurodynia: Secondary | ICD-10-CM | POA: Diagnosis not present

## 2018-01-26 DIAGNOSIS — Z885 Allergy status to narcotic agent status: Secondary | ICD-10-CM

## 2018-01-26 DIAGNOSIS — F17200 Nicotine dependence, unspecified, uncomplicated: Secondary | ICD-10-CM | POA: Diagnosis present

## 2018-01-26 DIAGNOSIS — K219 Gastro-esophageal reflux disease without esophagitis: Secondary | ICD-10-CM | POA: Diagnosis present

## 2018-01-26 DIAGNOSIS — Z880 Allergy status to penicillin: Secondary | ICD-10-CM | POA: Diagnosis not present

## 2018-01-26 DIAGNOSIS — E669 Obesity, unspecified: Secondary | ICD-10-CM | POA: Diagnosis present

## 2018-01-26 DIAGNOSIS — R52 Pain, unspecified: Secondary | ICD-10-CM

## 2018-01-26 DIAGNOSIS — Z981 Arthrodesis status: Secondary | ICD-10-CM | POA: Diagnosis not present

## 2018-01-26 DIAGNOSIS — Z419 Encounter for procedure for purposes other than remedying health state, unspecified: Secondary | ICD-10-CM

## 2018-01-26 DIAGNOSIS — J449 Chronic obstructive pulmonary disease, unspecified: Secondary | ICD-10-CM | POA: Diagnosis present

## 2018-01-26 DIAGNOSIS — R079 Chest pain, unspecified: Secondary | ICD-10-CM | POA: Diagnosis not present

## 2018-01-26 DIAGNOSIS — Z6830 Body mass index (BMI) 30.0-30.9, adult: Secondary | ICD-10-CM | POA: Diagnosis not present

## 2018-01-26 DIAGNOSIS — F419 Anxiety disorder, unspecified: Secondary | ICD-10-CM | POA: Diagnosis present

## 2018-01-26 HISTORY — PX: ANTERIOR CERVICAL DECOMP/DISCECTOMY FUSION: SHX1161

## 2018-01-26 LAB — COMPREHENSIVE METABOLIC PANEL
ALBUMIN: 3.5 g/dL (ref 3.5–5.0)
ALK PHOS: 89 U/L (ref 38–126)
ALT: 17 U/L (ref 14–54)
AST: 15 U/L (ref 15–41)
Anion gap: 11 (ref 5–15)
BILIRUBIN TOTAL: 0.9 mg/dL (ref 0.3–1.2)
BUN: 15 mg/dL (ref 6–20)
CALCIUM: 9.3 mg/dL (ref 8.9–10.3)
CO2: 20 mmol/L — ABNORMAL LOW (ref 22–32)
Chloride: 105 mmol/L (ref 101–111)
Creatinine, Ser: 0.98 mg/dL (ref 0.44–1.00)
GFR calc Af Amer: >60 mL/min (ref >60)
GFR calc non Af Amer: >60 mL/min (ref >60)
GLUCOSE: 136 mg/dL — AB (ref 65–99)
POTASSIUM: 4.4 mmol/L (ref 3.5–5.1)
Sodium: 136 mmol/L (ref 135–145)
Total Protein: 6.5 g/dL (ref 6.5–8.1)

## 2018-01-26 LAB — CBC WITH DIFFERENTIAL/PLATELET
Basophils Absolute: 0 10*3/uL (ref 0.0–0.1)
Basophils Relative: 0 %
EOS ABS: 0.1 10*3/uL (ref 0.0–0.7)
EOS PCT: 1 %
HEMATOCRIT: 36.9 % (ref 36.0–46.0)
HEMOGLOBIN: 11.9 g/dL — AB (ref 12.0–15.0)
LYMPHS ABS: 1.5 10*3/uL (ref 0.7–4.0)
LYMPHS PCT: 15 %
MCH: 29.6 pg (ref 26.0–34.0)
MCHC: 32.2 g/dL (ref 30.0–36.0)
MCV: 91.8 fL (ref 78.0–100.0)
MONO ABS: 0.6 10*3/uL (ref 0.1–1.0)
MONOS PCT: 6 %
Neutro Abs: 8 10*3/uL — ABNORMAL HIGH (ref 1.7–7.7)
Neutrophils Relative %: 78 %
Platelets: 291 10*3/uL (ref 150–400)
RBC: 4.02 MIL/uL (ref 3.87–5.11)
RDW: 13.9 % (ref 11.5–15.5)
WBC: 10.2 10*3/uL (ref 4.0–10.5)

## 2018-01-26 LAB — URINALYSIS, ROUTINE W REFLEX MICROSCOPIC
GLUCOSE, UA: NEGATIVE mg/dL
HGB URINE DIPSTICK: NEGATIVE
KETONES UR: 5 mg/dL — AB
LEUKOCYTES UA: NEGATIVE
Nitrite: NEGATIVE
PROTEIN: NEGATIVE mg/dL
Specific Gravity, Urine: 1.032 — ABNORMAL HIGH (ref 1.005–1.030)
pH: 5 (ref 5.0–8.0)

## 2018-01-26 LAB — GLUCOSE, CAPILLARY
Glucose-Capillary: 141 mg/dL — ABNORMAL HIGH (ref 65–99)
Glucose-Capillary: 126 mg/dL — ABNORMAL HIGH (ref 65–99)

## 2018-01-26 LAB — PROTIME-INR
INR: 1.03
Prothrombin Time: 13.4 seconds (ref 11.4–15.2)

## 2018-01-26 LAB — APTT: aPTT: 32 seconds (ref 24–36)

## 2018-01-26 LAB — MRSA PCR SCREENING: MRSA BY PCR: NEGATIVE

## 2018-01-26 SURGERY — ANTERIOR CERVICAL DECOMPRESSION/DISCECTOMY FUSION 1 LEVEL
Anesthesia: General | Site: Spine Cervical

## 2018-01-26 MED ORDER — POTASSIUM CHLORIDE IN NACL 20-0.9 MEQ/L-% IV SOLN
INTRAVENOUS | Status: DC
  Administered 2018-01-26: 20:00:00 via INTRAVENOUS
  Filled 2018-01-26: qty 1000

## 2018-01-26 MED ORDER — METFORMIN HCL 500 MG PO TABS: 500.0000 mg | ORAL_TABLET | Freq: Two times a day (BID) | ORAL | Status: DC

## 2018-01-26 MED ORDER — LIDOCAINE 2% (20 MG/ML) 5 ML SYRINGE
INTRAMUSCULAR | Status: AC
  Filled 2018-01-26: qty 5

## 2018-01-26 MED ORDER — THROMBIN (RECOMBINANT) 20000 UNITS EX SOLR
CUTANEOUS | Status: AC
  Filled 2018-01-26: qty 20000

## 2018-01-26 MED ORDER — SODIUM CHLORIDE 0.9% FLUSH: 3.0000 mL | INTRAVENOUS | Status: DC | PRN

## 2018-01-26 MED ORDER — AMPHETAMINE-DEXTROAMPHETAMINE 10 MG PO TABS: 30.0000 mg | ORAL_TABLET | Freq: Two times a day (BID) | ORAL | Status: DC

## 2018-01-26 MED ORDER — PHENYLEPHRINE 40 MCG/ML (10ML) SYRINGE FOR IV PUSH (FOR BLOOD PRESSURE SUPPORT)
PREFILLED_SYRINGE | INTRAVENOUS | Status: AC
  Filled 2018-01-26: qty 10

## 2018-01-26 MED ORDER — SODIUM CHLORIDE 0.9 % IV SOLN: 250.0000 mL | INTRAVENOUS | Status: DC

## 2018-01-26 MED ORDER — SUGAMMADEX SODIUM 200 MG/2ML IV SOLN
INTRAVENOUS | Status: AC
  Filled 2018-01-26: qty 2

## 2018-01-26 MED ORDER — ALBUTEROL SULFATE HFA 108 (90 BASE) MCG/ACT IN AERS
INHALATION_SPRAY | RESPIRATORY_TRACT | Status: DC | PRN
  Administered 2018-01-26: 2 via RESPIRATORY_TRACT

## 2018-01-26 MED ORDER — CEFAZOLIN SODIUM-DEXTROSE 1-4 GM/50ML-% IV SOLN
1.0000 g | Freq: Three times a day (TID) | INTRAVENOUS | Status: AC
  Administered 2018-01-26 – 2018-01-27 (×2): 1 g via INTRAVENOUS
  Filled 2018-01-26 (×2): qty 50

## 2018-01-26 MED ORDER — NABUMETONE 750 MG PO TABS
750.0000 mg | ORAL_TABLET | Freq: Two times a day (BID) | ORAL | Status: DC
  Filled 2018-01-26 (×2): qty 1

## 2018-01-26 MED ORDER — ROCURONIUM BROMIDE 100 MG/10ML IV SOLN
INTRAVENOUS | Status: DC | PRN
  Administered 2018-01-26: 10 mg via INTRAVENOUS
  Administered 2018-01-26: 50 mg via INTRAVENOUS
  Administered 2018-01-26: 20 mg via INTRAVENOUS

## 2018-01-26 MED ORDER — OXYCODONE HCL 5 MG PO TABS
ORAL_TABLET | ORAL | Status: AC
  Filled 2018-01-26: qty 1

## 2018-01-26 MED ORDER — OXYCODONE HCL 5 MG PO TABS
5.0000 mg | ORAL_TABLET | Freq: Once | ORAL | Status: AC | PRN
  Administered 2018-01-26: 5 mg via ORAL

## 2018-01-26 MED ORDER — PANTOPRAZOLE SODIUM 40 MG PO TBEC: 40.0000 mg | DELAYED_RELEASE_TABLET | Freq: Every day | ORAL | Status: DC

## 2018-01-26 MED ORDER — MIDAZOLAM HCL 2 MG/2ML IJ SOLN
INTRAMUSCULAR | Status: AC
  Filled 2018-01-26: qty 2

## 2018-01-26 MED ORDER — ALBUMIN HUMAN 5 % IV SOLN
INTRAVENOUS | Status: DC | PRN
  Administered 2018-01-26: 13:00:00 via INTRAVENOUS

## 2018-01-26 MED ORDER — ALBUTEROL SULFATE HFA 108 (90 BASE) MCG/ACT IN AERS
INHALATION_SPRAY | RESPIRATORY_TRACT | Status: AC
  Filled 2018-01-26: qty 6.7

## 2018-01-26 MED ORDER — MENTHOL 3 MG MT LOZG: 1.0000 | LOZENGE | OROMUCOSAL | Status: DC | PRN

## 2018-01-26 MED ORDER — PHENOL 1.4 % MT LIQD: 1.0000 | OROMUCOSAL | Status: DC | PRN

## 2018-01-26 MED ORDER — HEMOSTATIC AGENTS (NO CHARGE) OPTIME
TOPICAL | Status: DC | PRN
  Administered 2018-01-26: 1 via TOPICAL

## 2018-01-26 MED ORDER — TRAZODONE HCL 100 MG PO TABS
100.0000 mg | ORAL_TABLET | Freq: Every day | ORAL | Status: DC
  Administered 2018-01-26: 200 mg via ORAL
  Filled 2018-01-26: qty 2

## 2018-01-26 MED ORDER — DEXAMETHASONE SODIUM PHOSPHATE 10 MG/ML IJ SOLN
INTRAMUSCULAR | Status: DC | PRN
  Administered 2018-01-26: 10 mg via INTRAVENOUS

## 2018-01-26 MED ORDER — POVIDONE-IODINE 7.5 % EX SOLN
Freq: Once | CUTANEOUS | Status: AC
  Administered 2018-01-26: 10:00:00 via TOPICAL

## 2018-01-26 MED ORDER — LISINOPRIL 20 MG PO TABS: 20.0000 mg | ORAL_TABLET | Freq: Every day | ORAL | Status: DC

## 2018-01-26 MED ORDER — ALBUTEROL SULFATE (2.5 MG/3ML) 0.083% IN NEBU: 2.5000 mg | INHALATION_SOLUTION | Freq: Four times a day (QID) | RESPIRATORY_TRACT | Status: DC | PRN

## 2018-01-26 MED ORDER — SUCCINYLCHOLINE CHLORIDE 200 MG/10ML IV SOSY
PREFILLED_SYRINGE | INTRAVENOUS | Status: AC
  Filled 2018-01-26: qty 10

## 2018-01-26 MED ORDER — ONDANSETRON HCL 4 MG/2ML IJ SOLN: 4.0000 mg | Freq: Four times a day (QID) | INTRAMUSCULAR | Status: DC | PRN

## 2018-01-26 MED ORDER — PROPOFOL 10 MG/ML IV BOLUS
INTRAVENOUS | Status: DC | PRN
  Administered 2018-01-26: 140 mg via INTRAVENOUS

## 2018-01-26 MED ORDER — ROCURONIUM BROMIDE 10 MG/ML (PF) SYRINGE
PREFILLED_SYRINGE | INTRAVENOUS | Status: AC
  Filled 2018-01-26: qty 5

## 2018-01-26 MED ORDER — HYDROMORPHONE HCL 1 MG/ML IJ SOLN
INTRAMUSCULAR | Status: AC
  Filled 2018-01-26: qty 1

## 2018-01-26 MED ORDER — LACTATED RINGERS IV SOLN
INTRAVENOUS | Status: DC
  Administered 2018-01-26 (×2): via INTRAVENOUS

## 2018-01-26 MED ORDER — ALUM & MAG HYDROXIDE-SIMETH 200-200-20 MG/5ML PO SUSP: 30.0000 mL | Freq: Four times a day (QID) | ORAL | Status: DC | PRN

## 2018-01-26 MED ORDER — ATORVASTATIN CALCIUM 20 MG PO TABS: 20.0000 mg | ORAL_TABLET | Freq: Every day | ORAL | Status: DC

## 2018-01-26 MED ORDER — LORATADINE 10 MG PO TABS: 10.0000 mg | ORAL_TABLET | Freq: Every day | ORAL | Status: DC

## 2018-01-26 MED ORDER — SUCCINYLCHOLINE CHLORIDE 20 MG/ML IJ SOLN
INTRAMUSCULAR | Status: DC | PRN
  Administered 2018-01-26: 120 mg via INTRAVENOUS

## 2018-01-26 MED ORDER — LIDOCAINE HCL (CARDIAC) 20 MG/ML IV SOLN
INTRAVENOUS | Status: DC | PRN
  Administered 2018-01-26: 80 mg via INTRAVENOUS

## 2018-01-26 MED ORDER — MOMETASONE FURO-FORMOTEROL FUM 200-5 MCG/ACT IN AERO
2.0000 | INHALATION_SPRAY | Freq: Two times a day (BID) | RESPIRATORY_TRACT | Status: DC
  Filled 2018-01-26: qty 8.8

## 2018-01-26 MED ORDER — 0.9 % SODIUM CHLORIDE (POUR BTL) OPTIME
TOPICAL | Status: DC | PRN
  Administered 2018-01-26 (×2): 1000 mL

## 2018-01-26 MED ORDER — THROMBIN (RECOMBINANT) 20000 UNITS EX SOLR
CUTANEOUS | Status: DC | PRN
  Administered 2018-01-26: 20000 [IU] via TOPICAL

## 2018-01-26 MED ORDER — HYDROMORPHONE HCL 1 MG/ML IJ SOLN
0.2500 mg | INTRAMUSCULAR | Status: DC | PRN
  Administered 2018-01-26 (×3): 0.5 mg via INTRAVENOUS

## 2018-01-26 MED ORDER — PHENYLEPHRINE HCL 10 MG/ML IJ SOLN
INTRAMUSCULAR | Status: DC | PRN
  Administered 2018-01-26 (×5): 80 ug via INTRAVENOUS

## 2018-01-26 MED ORDER — FLUOXETINE HCL 20 MG PO CAPS: 80.0000 mg | ORAL_CAPSULE | Freq: Every day | ORAL | Status: DC

## 2018-01-26 MED ORDER — ACETAMINOPHEN 10 MG/ML IV SOLN
1000.0000 mg | INTRAVENOUS | Status: AC
  Administered 2018-01-26: 1000 mg via INTRAVENOUS
  Filled 2018-01-26: qty 100

## 2018-01-26 MED ORDER — ALBUTEROL SULFATE HFA 108 (90 BASE) MCG/ACT IN AERS: 2.0000 | INHALATION_SPRAY | Freq: Four times a day (QID) | RESPIRATORY_TRACT | Status: DC | PRN

## 2018-01-26 MED ORDER — ONDANSETRON HCL 4 MG PO TABS: 4.0000 mg | ORAL_TABLET | Freq: Four times a day (QID) | ORAL | Status: DC | PRN

## 2018-01-26 MED ORDER — MIDAZOLAM HCL 5 MG/5ML IJ SOLN
INTRAMUSCULAR | Status: DC | PRN
  Administered 2018-01-26: 2 mg via INTRAVENOUS

## 2018-01-26 MED ORDER — DEXAMETHASONE SODIUM PHOSPHATE 10 MG/ML IJ SOLN
INTRAMUSCULAR | Status: AC
  Filled 2018-01-26: qty 1

## 2018-01-26 MED ORDER — VARENICLINE TARTRATE 0.5 MG PO TABS
0.5000 mg | ORAL_TABLET | Freq: Two times a day (BID) | ORAL | Status: DC
  Administered 2018-01-26: 0.5 mg via ORAL
  Filled 2018-01-26 (×3): qty 1

## 2018-01-26 MED ORDER — ZOLPIDEM TARTRATE 5 MG PO TABS: 5.0000 mg | ORAL_TABLET | Freq: Every evening | ORAL | Status: DC | PRN

## 2018-01-26 MED ORDER — FLUTICASONE PROPIONATE 50 MCG/ACT NA SUSP
2.0000 | Freq: Every day | NASAL | Status: DC
  Filled 2018-01-26: qty 16

## 2018-01-26 MED ORDER — BUPIVACAINE-EPINEPHRINE (PF) 0.5% -1:200000 IJ SOLN
INTRAMUSCULAR | Status: AC
  Filled 2018-01-26: qty 30

## 2018-01-26 MED ORDER — SUGAMMADEX SODIUM 200 MG/2ML IV SOLN
INTRAVENOUS | Status: DC | PRN
  Administered 2018-01-26: 175 mg via INTRAVENOUS

## 2018-01-26 MED ORDER — PHENYLEPHRINE HCL 10 MG/ML IJ SOLN
INTRAVENOUS | Status: DC | PRN
  Administered 2018-01-26: 20 ug/min via INTRAVENOUS

## 2018-01-26 MED ORDER — SODIUM CHLORIDE 0.9% FLUSH
3.0000 mL | Freq: Two times a day (BID) | INTRAVENOUS | Status: DC
  Administered 2018-01-26: 3 mL via INTRAVENOUS

## 2018-01-26 MED ORDER — BUPIVACAINE-EPINEPHRINE 0.25% -1:200000 IJ SOLN
INTRAMUSCULAR | Status: DC | PRN
  Administered 2018-01-26: 10 mL

## 2018-01-26 MED ORDER — ASPIRIN-ACETAMINOPHEN-CAFFEINE 250-250-65 MG PO TABS
2.0000 | ORAL_TABLET | Freq: Every day | ORAL | Status: DC | PRN
  Filled 2018-01-26: qty 2

## 2018-01-26 MED ORDER — VANCOMYCIN HCL IN DEXTROSE 1-5 GM/200ML-% IV SOLN: 1000.0000 mg | INTRAVENOUS | Status: DC

## 2018-01-26 MED ORDER — ONDANSETRON HCL 4 MG/2ML IJ SOLN
INTRAMUSCULAR | Status: DC | PRN
  Administered 2018-01-26: 4 mg via INTRAVENOUS

## 2018-01-26 MED ORDER — HYDROMORPHONE HCL 1 MG/ML IJ SOLN
INTRAMUSCULAR | Status: AC
  Administered 2018-01-26: 0.5 mg via INTRAVENOUS
  Filled 2018-01-26: qty 1

## 2018-01-26 MED ORDER — PROMETHAZINE HCL 25 MG/ML IJ SOLN: 6.2500 mg | INTRAMUSCULAR | Status: DC | PRN

## 2018-01-26 MED ORDER — OXYCODONE HCL 5 MG/5ML PO SOLN: 5.0000 mg | Freq: Once | ORAL | Status: AC | PRN

## 2018-01-26 MED ORDER — HYDROXYZINE HCL 25 MG PO TABS
25.0000 mg | ORAL_TABLET | Freq: Three times a day (TID) | ORAL | Status: DC | PRN
  Administered 2018-01-26: 25 mg via ORAL
  Filled 2018-01-26: qty 1

## 2018-01-26 MED ORDER — FENTANYL CITRATE (PF) 100 MCG/2ML IJ SOLN
INTRAMUSCULAR | Status: DC | PRN
  Administered 2018-01-26 (×3): 50 ug via INTRAVENOUS
  Administered 2018-01-26: 100 ug via INTRAVENOUS

## 2018-01-26 MED ORDER — PROPOFOL 10 MG/ML IV BOLUS
INTRAVENOUS | Status: AC
  Filled 2018-01-26: qty 20

## 2018-01-26 MED ORDER — ONDANSETRON HCL 4 MG/2ML IJ SOLN
INTRAMUSCULAR | Status: AC
  Filled 2018-01-26: qty 2

## 2018-01-26 MED ORDER — VANCOMYCIN HCL IN DEXTROSE 1-5 GM/200ML-% IV SOLN
1000.0000 mg | INTRAVENOUS | Status: AC
  Administered 2018-01-26 (×2): 1000 mg via INTRAVENOUS
  Filled 2018-01-26: qty 200

## 2018-01-26 MED ORDER — OXYCODONE-ACETAMINOPHEN 5-325 MG PO TABS
1.0000 | ORAL_TABLET | ORAL | Status: DC | PRN
  Administered 2018-01-26 – 2018-01-27 (×2): 2 via ORAL
  Filled 2018-01-26 (×2): qty 2

## 2018-01-26 MED ORDER — ACETAMINOPHEN 325 MG PO TABS: 650.0000 mg | ORAL_TABLET | ORAL | Status: DC | PRN

## 2018-01-26 MED ORDER — ACETAMINOPHEN 650 MG RE SUPP: 650.0000 mg | RECTAL | Status: DC | PRN

## 2018-01-26 MED ORDER — ALPRAZOLAM 0.5 MG PO TABS: 1.0000 mg | ORAL_TABLET | Freq: Three times a day (TID) | ORAL | Status: DC | PRN

## 2018-01-26 MED ORDER — FENTANYL CITRATE (PF) 250 MCG/5ML IJ SOLN
INTRAMUSCULAR | Status: AC
  Filled 2018-01-26: qty 5

## 2018-01-26 SURGICAL SUPPLY — 75 items
APL SKNCLS STERI-STRIP NONHPOA (GAUZE/BANDAGES/DRESSINGS) ×1
BENZOIN TINCTURE PRP APPL 2/3 (GAUZE/BANDAGES/DRESSINGS) ×3 IMPLANT
BIT DRILL NEURO 2X3.1 SFT TUCH (MISCELLANEOUS) ×1 IMPLANT
BIT DRILL SRG 14X2.2XFLT CHK (BIT) IMPLANT
BIT DRL SRG 14X2.2XFLT CHK (BIT) ×1
BLADE CLIPPER SURG (BLADE) ×1 IMPLANT
BLADE SURG 15 STRL LF DISP TIS (BLADE) ×1 IMPLANT
BLADE SURG 15 STRL SS (BLADE) ×3
BONE VIVIGEN FORMABLE 1.3CC (Bone Implant) ×3 IMPLANT
BUR MATCHSTICK NEURO 3.0 LAGG (BURR) IMPLANT
CARTRIDGE OIL MAESTRO DRILL (MISCELLANEOUS) ×1 IMPLANT
CLOSURE WOUND 1/2 X4 (GAUZE/BANDAGES/DRESSINGS) ×1
CORDS BIPOLAR (ELECTRODE) ×3 IMPLANT
COVER SURGICAL LIGHT HANDLE (MISCELLANEOUS) ×3 IMPLANT
CRADLE DONUT ADULT HEAD (MISCELLANEOUS) ×3 IMPLANT
DIFFUSER DRILL AIR PNEUMATIC (MISCELLANEOUS) ×3 IMPLANT
DRAIN JACKSON RD 7FR 3/32 (WOUND CARE) IMPLANT
DRAPE C-ARM 42X72 X-RAY (DRAPES) ×3 IMPLANT
DRAPE POUCH INSTRU U-SHP 10X18 (DRAPES) ×3 IMPLANT
DRAPE SURG 17X23 STRL (DRAPES) ×9 IMPLANT
DRILL BIT SKYLINE 14MM (BIT) ×3
DRILL NEURO 2X3.1 SOFT TOUCH (MISCELLANEOUS) ×3
DURAPREP 26ML APPLICATOR (WOUND CARE) ×3 IMPLANT
ELECT COATED BLADE 2.86 ST (ELECTRODE) ×3 IMPLANT
ELECT REM PT RETURN 9FT ADLT (ELECTROSURGICAL) ×3
ELECTRODE REM PT RTRN 9FT ADLT (ELECTROSURGICAL) ×1 IMPLANT
EVACUATOR SILICONE 100CC (DRAIN) IMPLANT
GAUZE SPONGE 4X4 12PLY STRL (GAUZE/BANDAGES/DRESSINGS) ×3 IMPLANT
GAUZE SPONGE 4X4 16PLY XRAY LF (GAUZE/BANDAGES/DRESSINGS) ×3 IMPLANT
GLOVE BIO SURGEON STRL SZ7 (GLOVE) ×3 IMPLANT
GLOVE BIO SURGEON STRL SZ8 (GLOVE) ×3 IMPLANT
GLOVE BIOGEL PI IND STRL 7.0 (GLOVE) ×2 IMPLANT
GLOVE BIOGEL PI IND STRL 8 (GLOVE) ×1 IMPLANT
GLOVE BIOGEL PI INDICATOR 7.0 (GLOVE) ×4
GLOVE BIOGEL PI INDICATOR 8 (GLOVE) ×2
GOWN STRL REUS W/ TWL LRG LVL3 (GOWN DISPOSABLE) ×1 IMPLANT
GOWN STRL REUS W/ TWL XL LVL3 (GOWN DISPOSABLE) ×1 IMPLANT
GOWN STRL REUS W/TWL LRG LVL3 (GOWN DISPOSABLE) ×3
GOWN STRL REUS W/TWL XL LVL3 (GOWN DISPOSABLE) ×3
GRAFT BNE MATRIX VG FRMBL SM 1 (Bone Implant) IMPLANT
INTERLOCK LRDTC CRVCL VBR 7MM (Bone Implant) IMPLANT
IV CATH 14GX2 1/4 (CATHETERS) ×3 IMPLANT
KIT BASIN OR (CUSTOM PROCEDURE TRAY) ×3 IMPLANT
KIT ROOM TURNOVER OR (KITS) ×3 IMPLANT
LORDOTIC CERVICAL VBR 7MM SM (Bone Implant) ×3 IMPLANT
MANIFOLD NEPTUNE II (INSTRUMENTS) ×3 IMPLANT
NDL PRECISIONGLIDE 27X1.5 (NEEDLE) ×1 IMPLANT
NDL SPNL 20GX3.5 QUINCKE YW (NEEDLE) ×1 IMPLANT
NEEDLE PRECISIONGLIDE 27X1.5 (NEEDLE) ×3 IMPLANT
NEEDLE SPNL 20GX3.5 QUINCKE YW (NEEDLE) ×3 IMPLANT
NS IRRIG 1000ML POUR BTL (IV SOLUTION) ×3 IMPLANT
OIL CARTRIDGE MAESTRO DRILL (MISCELLANEOUS) ×3
PACK ORTHO CERVICAL (CUSTOM PROCEDURE TRAY) ×3 IMPLANT
PAD ARMBOARD 7.5X6 YLW CONV (MISCELLANEOUS) ×6 IMPLANT
PATTIES SURGICAL .5 X.5 (GAUZE/BANDAGES/DRESSINGS) IMPLANT
PATTIES SURGICAL .5 X1 (DISPOSABLE) IMPLANT
PIN DISTRACTION 14 (PIN) ×4 IMPLANT
PLATE SKYLINE 12MM (Plate) ×2 IMPLANT
SCREW SKYLINE VAR OS 14MM (Screw) ×8 IMPLANT
SPONGE INTESTINAL PEANUT (DISPOSABLE) ×3 IMPLANT
SPONGE SURGIFOAM ABS GEL 100 (HEMOSTASIS) IMPLANT
STRIP CLOSURE SKIN 1/2X4 (GAUZE/BANDAGES/DRESSINGS) ×2 IMPLANT
SURGIFLO W/THROMBIN 8M KIT (HEMOSTASIS) IMPLANT
SUT MNCRL AB 4-0 PS2 18 (SUTURE) ×2 IMPLANT
SUT SILK 4 0 (SUTURE) ×3
SUT SILK 4-0 18XBRD TIE 12 (SUTURE) IMPLANT
SUT VIC AB 2-0 CT2 18 VCP726D (SUTURE) ×5 IMPLANT
SYR BULB IRRIGATION 50ML (SYRINGE) ×3 IMPLANT
SYR CONTROL 10ML LL (SYRINGE) ×6 IMPLANT
TAPE CLOTH 4X10 WHT NS (GAUZE/BANDAGES/DRESSINGS) ×3 IMPLANT
TAPE UMBILICAL COTTON 1/8X30 (MISCELLANEOUS) ×3 IMPLANT
TOWEL OR 17X24 6PK STRL BLUE (TOWEL DISPOSABLE) ×3 IMPLANT
TOWEL OR 17X26 10 PK STRL BLUE (TOWEL DISPOSABLE) ×3 IMPLANT
WATER STERILE IRR 1000ML POUR (IV SOLUTION) ×3 IMPLANT
YANKAUER SUCT BULB TIP NO VENT (SUCTIONS) ×3 IMPLANT
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
## 2018-01-26 NOTE — Anesthesia Preprocedure Evaluation (Addendum)
Anesthesia Evaluation  Patient identified by MRN, date of birth, ID band  Reviewed: Allergy & Precautions, NPO status , Patient's Chart, lab work & pertinent test results  Airway Mallampati: III  TM Distance: >3 FB Neck ROM: Full  Mouth opening: Limited Mouth Opening Comment: c-collar Dental  (+) Edentulous Upper   Pulmonary COPD,  COPD inhaler, Current Smoker,    Pulmonary exam normal breath sounds clear to auscultation- rhonchi (-) wheezing      Cardiovascular hypertension, Pt. on medications Normal cardiovascular exam Rhythm:Regular Rate:Normal  ECG: NSR, rate 82   Neuro/Psych  Headaches, PSYCHIATRIC DISORDERS Anxiety Depression    GI/Hepatic Neg liver ROS, GERD  Medicated and Controlled,  Endo/Other  diabetes, Oral Hypoglycemic Agents  Renal/GU negative Renal ROS     Musculoskeletal negative musculoskeletal ROS (+)   Abdominal (+) - obese,   Peds  Hematology  (+) anemia , HLD   Anesthesia Other Findings   Reproductive/Obstetrics                            Anesthesia Physical  Anesthesia Plan  ASA: III  Anesthesia Plan: General   Post-op Pain Management:    Induction: Intravenous  PONV Risk Score and Plan: 2 and Ondansetron and Treatment may vary due to age or medical condition  Airway Management Planned: Oral ETT and Video Laryngoscope Planned  Additional Equipment:   Intra-op Plan:   Post-operative Plan: Extubation in OR and Possible Post-op intubation/ventilation  Informed Consent: I have reviewed the patients History and Physical, chart, labs and discussed the procedure including the risks, benefits and alternatives for the proposed anesthesia with the patient or authorized representative who has indicated his/her understanding and acceptance.   Dental advisory given  Plan Discussed with: CRNA  Anesthesia Plan Comments:        Anesthesia Quick Evaluation

## 2018-01-26 NOTE — Progress Notes (Signed)
Received patient from PACU with Aspen collar in place, alert oriented x 4, and very talkative pleasantly. Patient states she looke forward to getting the rest of her surgery completed tomorrow.

## 2018-01-26 NOTE — Anesthesia Preprocedure Evaluation (Addendum)
Anesthesia Evaluation  Patient identified by MRN, date of birth, ID band Patient awake    Reviewed: Allergy & Precautions, H&P , NPO status , Patient's Chart, lab work & pertinent test results  History of Anesthesia Complications Negative for: history of anesthetic complications  Airway Mallampati: II  TM Distance: >3 FB Neck ROM: Limited    Dental  (+) Edentulous Upper, Poor Dentition, Dental Advisory Given   Pulmonary shortness of breath, COPD, Current Smoker,    - rhonchi + wheezing      Cardiovascular hypertension, Pt. on medications  Rhythm:Regular Rate:Normal     Neuro/Psych Anxiety Depression  Neuromuscular disease    GI/Hepatic hiatal hernia, GERD  Medicated and Controlled,  Endo/Other  diabetes, Type 2, Oral Hypoglycemic Agents  Renal/GU      Musculoskeletal   Abdominal (+) + obese,   Peds  Hematology   Anesthesia Other Findings   Reproductive/Obstetrics                            Anesthesia Physical  Anesthesia Plan  ASA: III  Anesthesia Plan: General   Post-op Pain Management:    Induction: Intravenous  PONV Risk Score and Plan: 2 and Ondansetron and Midazolam  Airway Management Planned: Oral ETT  Additional Equipment:   Intra-op Plan:   Post-operative Plan: Extubation in OR  Informed Consent: I have reviewed the patients History and Physical, chart, labs and discussed the procedure including the risks, benefits and alternatives for the proposed anesthesia with the patient or authorized representative who has indicated his/her understanding and acceptance.   Dental advisory given  Plan Discussed with: CRNA and Surgeon  Anesthesia Plan Comments:         Anesthesia Quick Evaluation

## 2018-01-26 NOTE — Anesthesia Postprocedure Evaluation (Signed)
Anesthesia Post Note  Patient: Nelva NayFrances M Markel  Procedure(s) Performed: ANTERIOR CERVICAL DECOMPRESSION FUSION, CERVICAL 4-5 WITH INSTRUMENTATION AND ALLOGRAFT REQUESTED TIME 2.5 HRS ( IN A FLIP ROOM ) (N/A Spine Cervical)     Patient location during evaluation: PACU Anesthesia Type: General Level of consciousness: awake and alert Pain management: pain level controlled Vital Signs Assessment: post-procedure vital signs reviewed and stable Respiratory status: spontaneous breathing, nonlabored ventilation and respiratory function stable Cardiovascular status: blood pressure returned to baseline and stable Postop Assessment: no apparent nausea or vomiting Anesthetic complications: no    Last Vitals:  Vitals:   01/26/18 1400 01/26/18 1415  BP:    Pulse: 93 93  Resp: 20 19  Temp:    SpO2: 92% 94%    Last Pain:  Vitals:   01/26/18 1400  TempSrc:   PainSc: Asleep                 Lowella CurbWarren Ray Saleemah Mollenhauer

## 2018-01-26 NOTE — Anesthesia Procedure Notes (Signed)
Procedure Name: Intubation Date/Time: 01/26/2018 11:38 AM Performed by: Lovie Cholock, Felise Georgia K, CRNA Pre-anesthesia Checklist: Patient identified, Emergency Drugs available, Suction available and Patient being monitored Patient Re-evaluated:Patient Re-evaluated prior to induction Oxygen Delivery Method: Circle System Utilized Preoxygenation: Pre-oxygenation with 100% oxygen Induction Type: IV induction Ventilation: Mask ventilation without difficulty Laryngoscope Size: Glidescope and 4 Grade View: Grade I Tube type: Oral Tube size: 7.5 mm Number of attempts: 1 Airway Equipment and Method: Video-laryngoscopy and Rigid stylet Placement Confirmation: ETT inserted through vocal cords under direct vision,  positive ETCO2 and breath sounds checked- equal and bilateral Secured at: 21 cm Tube secured with: Tape Dental Injury: Teeth and Oropharynx as per pre-operative assessment

## 2018-01-26 NOTE — Transfer of Care (Signed)
Immediate Anesthesia Transfer of Care Note  Patient: Alison Kim  Procedure(s) Performed: ANTERIOR CERVICAL DECOMPRESSION FUSION, CERVICAL 4-5 WITH INSTRUMENTATION AND ALLOGRAFT REQUESTED TIME 2.5 HRS ( IN A FLIP ROOM ) (N/A Spine Cervical)  Patient Location: PACU  Anesthesia Type:General  Level of Consciousness: awake, oriented and patient cooperative  Airway & Oxygen Therapy: Patient Spontanous Breathing and Patient connected to nasal cannula oxygen  Post-op Assessment: Report given to RN and Post -op Vital signs reviewed and stable  Post vital signs: Reviewed  Last Vitals:  Vitals:   01/26/18 0858  BP: 115/67  Pulse: 79  Resp: 18  Temp: 36.9 C  SpO2: 97%    Last Pain:  Vitals:   01/26/18 0922  TempSrc:   PainSc: 5       Patients Stated Pain Goal: 3 (01/26/18 [id number])  Complications: No apparent anesthesia complications

## 2018-01-27 ENCOUNTER — Inpatient Hospital Stay (HOSPITAL_COMMUNITY): Payer: Medicare Other | Admitting: Anesthesiology

## 2018-01-27 ENCOUNTER — Inpatient Hospital Stay (HOSPITAL_COMMUNITY): Payer: Medicare Other

## 2018-01-27 ENCOUNTER — Other Ambulatory Visit: Payer: Self-pay

## 2018-01-27 ENCOUNTER — Inpatient Hospital Stay (HOSPITAL_COMMUNITY): Admission: RE | Admit: 2018-01-27 | Payer: Medicare Other | Source: Ambulatory Visit | Admitting: Orthopedic Surgery

## 2018-01-27 ENCOUNTER — Encounter (HOSPITAL_COMMUNITY)
Admission: RE | Disposition: A | Discharge status: Home / Self Care | Payer: Self-pay | Source: Ambulatory Visit | Attending: Orthopedic Surgery

## 2018-01-27 HISTORY — PX: POSTERIOR CERVICAL FUSION/FORAMINOTOMY: SHX5038

## 2018-01-27 LAB — GLUCOSE, CAPILLARY: GLUCOSE-CAPILLARY: 171 mg/dL — AB (ref 65–99)

## 2018-01-27 SURGERY — POSTERIOR CERVICAL FUSION/FORAMINOTOMY LEVEL 4
Anesthesia: General

## 2018-01-27 MED ORDER — MOMETASONE FURO-FORMOTEROL FUM 200-5 MCG/ACT IN AERO
2.0000 | INHALATION_SPRAY | Freq: Two times a day (BID) | RESPIRATORY_TRACT | Status: DC
  Administered 2018-01-27 – 2018-01-31 (×5): 2 via RESPIRATORY_TRACT
  Filled 2018-01-27 (×2): qty 8.8

## 2018-01-27 MED ORDER — HYDROMORPHONE HCL 1 MG/ML IJ SOLN
INTRAMUSCULAR | Status: AC
  Filled 2018-01-27: qty 1

## 2018-01-27 MED ORDER — PROMETHAZINE HCL 25 MG/ML IJ SOLN: 6.2500 mg | INTRAMUSCULAR | Status: DC | PRN

## 2018-01-27 MED ORDER — ALUM & MAG HYDROXIDE-SIMETH 200-200-20 MG/5ML PO SUSP: 30.0000 mL | Freq: Four times a day (QID) | ORAL | Status: DC | PRN

## 2018-01-27 MED ORDER — ATORVASTATIN CALCIUM 20 MG PO TABS
20.0000 mg | ORAL_TABLET | Freq: Every day | ORAL | Status: DC
  Administered 2018-01-27 – 2018-01-31 (×5): 20 mg via ORAL
  Filled 2018-01-27 (×5): qty 1

## 2018-01-27 MED ORDER — DIPHENHYDRAMINE HCL 25 MG PO CAPS
25.0000 mg | ORAL_CAPSULE | Freq: Four times a day (QID) | ORAL | Status: DC | PRN
  Administered 2018-01-27: 25 mg via ORAL
  Filled 2018-01-27: qty 1

## 2018-01-27 MED ORDER — FLUTICASONE PROPIONATE 50 MCG/ACT NA SUSP
2.0000 | Freq: Every day | NASAL | Status: DC
  Administered 2018-01-27 – 2018-01-31 (×5): 2 via NASAL
  Filled 2018-01-27: qty 16

## 2018-01-27 MED ORDER — PHENOL 1.4 % MT LIQD: 1.0000 | OROMUCOSAL | Status: DC | PRN

## 2018-01-27 MED ORDER — LACTATED RINGERS IV SOLN
INTRAVENOUS | Status: DC | PRN
  Administered 2018-01-27 (×2): via INTRAVENOUS

## 2018-01-27 MED ORDER — PHENYLEPHRINE HCL 10 MG/ML IJ SOLN
INTRAVENOUS | Status: DC | PRN
  Administered 2018-01-27: 50 ug/min via INTRAVENOUS

## 2018-01-27 MED ORDER — ASPIRIN-ACETAMINOPHEN-CAFFEINE 250-250-65 MG PO TABS
2.0000 | ORAL_TABLET | Freq: Every day | ORAL | Status: DC | PRN
  Administered 2018-01-30: 2 via ORAL
  Filled 2018-01-27 (×2): qty 2

## 2018-01-27 MED ORDER — ROCURONIUM BROMIDE 100 MG/10ML IV SOLN
INTRAVENOUS | Status: DC | PRN
  Administered 2018-01-27: 20 mg via INTRAVENOUS
  Administered 2018-01-27: 30 mg via INTRAVENOUS
  Administered 2018-01-27: 20 mg via INTRAVENOUS
  Administered 2018-01-27: 50 mg via INTRAVENOUS

## 2018-01-27 MED ORDER — ONDANSETRON HCL 4 MG PO TABS: 4.0000 mg | ORAL_TABLET | Freq: Four times a day (QID) | ORAL | Status: DC | PRN

## 2018-01-27 MED ORDER — ALPRAZOLAM 0.5 MG PO TABS
1.0000 mg | ORAL_TABLET | Freq: Three times a day (TID) | ORAL | Status: DC | PRN
  Administered 2018-01-27: 1 mg via ORAL
  Filled 2018-01-27: qty 2

## 2018-01-27 MED ORDER — ACETAMINOPHEN 325 MG PO TABS: 650.0000 mg | ORAL_TABLET | ORAL | Status: DC | PRN

## 2018-01-27 MED ORDER — AMPHETAMINE-DEXTROAMPHETAMINE 10 MG PO TABS
30.0000 mg | ORAL_TABLET | Freq: Two times a day (BID) | ORAL | Status: DC
  Administered 2018-01-29 – 2018-01-30 (×4): 30 mg via ORAL
  Filled 2018-01-27 (×6): qty 3

## 2018-01-27 MED ORDER — LORATADINE 10 MG PO TABS
10.0000 mg | ORAL_TABLET | Freq: Every day | ORAL | Status: DC
  Administered 2018-01-27 – 2018-01-31 (×5): 10 mg via ORAL
  Filled 2018-01-27 (×5): qty 1

## 2018-01-27 MED ORDER — BUPIVACAINE-EPINEPHRINE 0.5% -1:200000 IJ SOLN
INTRAMUSCULAR | Status: DC | PRN
  Administered 2018-01-27: 20 mL

## 2018-01-27 MED ORDER — METHYLENE BLUE 0.5 % INJ SOLN
INTRAVENOUS | Status: AC
  Filled 2018-01-27: qty 10

## 2018-01-27 MED ORDER — SODIUM CHLORIDE 0.9 % IV SOLN: 250.0000 mL | INTRAVENOUS | Status: DC

## 2018-01-27 MED ORDER — ONDANSETRON HCL 4 MG/2ML IJ SOLN
INTRAMUSCULAR | Status: DC | PRN
  Administered 2018-01-27: 4 mg via INTRAVENOUS

## 2018-01-27 MED ORDER — POVIDONE-IODINE 7.5 % EX SOLN: Freq: Once | CUTANEOUS | Status: DC

## 2018-01-27 MED ORDER — PROPOFOL 10 MG/ML IV BOLUS
INTRAVENOUS | Status: DC | PRN
  Administered 2018-01-27: 150 mg via INTRAVENOUS

## 2018-01-27 MED ORDER — ZOLPIDEM TARTRATE 5 MG PO TABS: 5.0000 mg | ORAL_TABLET | Freq: Every evening | ORAL | Status: DC | PRN

## 2018-01-27 MED ORDER — METFORMIN HCL 500 MG PO TABS
500.0000 mg | ORAL_TABLET | Freq: Two times a day (BID) | ORAL | Status: DC
  Administered 2018-01-27 – 2018-01-28 (×3): 500 mg via ORAL
  Filled 2018-01-27 (×3): qty 1

## 2018-01-27 MED ORDER — PHENYLEPHRINE HCL 10 MG/ML IJ SOLN
INTRAMUSCULAR | Status: DC | PRN
  Administered 2018-01-27 (×4): 80 ug via INTRAVENOUS

## 2018-01-27 MED ORDER — 0.9 % SODIUM CHLORIDE (POUR BTL) OPTIME
TOPICAL | Status: DC | PRN
  Administered 2018-01-27: 1000 mL

## 2018-01-27 MED ORDER — LISINOPRIL 20 MG PO TABS
20.0000 mg | ORAL_TABLET | Freq: Every day | ORAL | Status: DC
  Administered 2018-01-27 – 2018-01-28 (×2): 20 mg via ORAL
  Filled 2018-01-27 (×2): qty 1

## 2018-01-27 MED ORDER — SUGAMMADEX SODIUM 200 MG/2ML IV SOLN
INTRAVENOUS | Status: DC | PRN
  Administered 2018-01-27: 200 mg via INTRAVENOUS

## 2018-01-27 MED ORDER — BACITRACIN ZINC 500 UNIT/GM EX OINT
TOPICAL_OINTMENT | CUTANEOUS | Status: DC | PRN
  Administered 2018-01-27: 1 via TOPICAL

## 2018-01-27 MED ORDER — OXYCODONE-ACETAMINOPHEN 5-325 MG PO TABS
1.0000 | ORAL_TABLET | ORAL | Status: DC | PRN
  Administered 2018-01-27 – 2018-01-28 (×6): 2 via ORAL
  Filled 2018-01-27 (×6): qty 2

## 2018-01-27 MED ORDER — ALBUTEROL SULFATE (2.5 MG/3ML) 0.083% IN NEBU: 2.5000 mg | INHALATION_SOLUTION | Freq: Four times a day (QID) | RESPIRATORY_TRACT | Status: DC | PRN

## 2018-01-27 MED ORDER — TRAZODONE HCL 100 MG PO TABS
100.0000 mg | ORAL_TABLET | Freq: Every evening | ORAL | Status: DC | PRN
  Administered 2018-01-27 – 2018-01-30 (×8): 100 mg via ORAL
  Filled 2018-01-27 (×8): qty 1

## 2018-01-27 MED ORDER — HYDROMORPHONE HCL 1 MG/ML IJ SOLN
0.5000 mg | INTRAMUSCULAR | Status: DC | PRN
  Administered 2018-01-27 – 2018-01-28 (×5): 0.5 mg via INTRAVENOUS
  Filled 2018-01-27 (×5): qty 0.5

## 2018-01-27 MED ORDER — BUPIVACAINE LIPOSOME 1.3 % IJ SUSP
20.0000 mL | INTRAMUSCULAR | Status: AC
  Administered 2018-01-27: 20 mL
  Filled 2018-01-27: qty 20

## 2018-01-27 MED ORDER — VARENICLINE TARTRATE 0.5 MG PO TABS
0.5000 mg | ORAL_TABLET | Freq: Two times a day (BID) | ORAL | Status: DC
  Administered 2018-01-27 – 2018-01-31 (×8): 0.5 mg via ORAL
  Filled 2018-01-27 (×9): qty 1

## 2018-01-27 MED ORDER — THROMBIN (RECOMBINANT) 20000 UNITS EX SOLR
CUTANEOUS | Status: DC | PRN
  Administered 2018-01-27: 20000 [IU] via TOPICAL

## 2018-01-27 MED ORDER — POTASSIUM CHLORIDE IN NACL 20-0.9 MEQ/L-% IV SOLN: INTRAVENOUS | Status: DC

## 2018-01-27 MED ORDER — HYDROMORPHONE HCL 1 MG/ML IJ SOLN
0.2500 mg | INTRAMUSCULAR | Status: DC | PRN
  Administered 2018-01-27: 0.5 mg via INTRAVENOUS

## 2018-01-27 MED ORDER — FLUOXETINE HCL 20 MG PO CAPS
80.0000 mg | ORAL_CAPSULE | Freq: Every day | ORAL | Status: DC
  Administered 2018-01-27 – 2018-01-31 (×5): 80 mg via ORAL
  Filled 2018-01-27 (×6): qty 4

## 2018-01-27 MED ORDER — THROMBIN (RECOMBINANT) 20000 UNITS EX SOLR
CUTANEOUS | Status: AC
  Filled 2018-01-27: qty 20000

## 2018-01-27 MED ORDER — DIAZEPAM 5 MG PO TABS
5.0000 mg | ORAL_TABLET | Freq: Four times a day (QID) | ORAL | Status: DC | PRN
  Administered 2018-01-27 – 2018-01-28 (×4): 5 mg via ORAL
  Filled 2018-01-27 (×5): qty 1

## 2018-01-27 MED ORDER — PANTOPRAZOLE SODIUM 40 MG PO TBEC
40.0000 mg | DELAYED_RELEASE_TABLET | Freq: Every day | ORAL | Status: DC
  Administered 2018-01-27 – 2018-01-31 (×5): 40 mg via ORAL
  Filled 2018-01-27 (×5): qty 1

## 2018-01-27 MED ORDER — VANCOMYCIN HCL 1000 MG IV SOLR
INTRAVENOUS | Status: DC | PRN
  Administered 2018-01-27: 1000 mg via INTRAVENOUS

## 2018-01-27 MED ORDER — ONDANSETRON HCL 4 MG/2ML IJ SOLN: 4.0000 mg | Freq: Four times a day (QID) | INTRAMUSCULAR | Status: DC | PRN

## 2018-01-27 MED ORDER — BUPIVACAINE-EPINEPHRINE (PF) 0.5% -1:200000 IJ SOLN
INTRAMUSCULAR | Status: AC
  Filled 2018-01-27: qty 30

## 2018-01-27 MED ORDER — BACITRACIN ZINC 500 UNIT/GM EX OINT
TOPICAL_OINTMENT | CUTANEOUS | Status: AC
  Filled 2018-01-27: qty 28.35

## 2018-01-27 MED ORDER — ACETAMINOPHEN 650 MG RE SUPP: 650.0000 mg | RECTAL | Status: DC | PRN

## 2018-01-27 MED ORDER — SODIUM CHLORIDE 0.9% FLUSH: 3.0000 mL | INTRAVENOUS | Status: DC | PRN

## 2018-01-27 MED ORDER — FENTANYL CITRATE (PF) 100 MCG/2ML IJ SOLN
INTRAMUSCULAR | Status: DC | PRN
  Administered 2018-01-27: 100 ug via INTRAVENOUS
  Administered 2018-01-27 (×3): 50 ug via INTRAVENOUS

## 2018-01-27 MED ORDER — VANCOMYCIN HCL IN DEXTROSE 1-5 GM/200ML-% IV SOLN
1000.0000 mg | Freq: Once | INTRAVENOUS | Status: AC
  Administered 2018-01-27: 1000 mg via INTRAVENOUS
  Filled 2018-01-27: qty 200

## 2018-01-27 MED ORDER — GLYCOPYRROLATE 0.2 MG/ML IJ SOLN
INTRAMUSCULAR | Status: DC | PRN
  Administered 2018-01-27: 0.2 mg via INTRAVENOUS

## 2018-01-27 MED ORDER — LIDOCAINE HCL (CARDIAC) 20 MG/ML IV SOLN
INTRAVENOUS | Status: DC | PRN
  Administered 2018-01-27: 40 mg via INTRAVENOUS

## 2018-01-27 MED ORDER — SODIUM CHLORIDE 0.9% FLUSH
3.0000 mL | Freq: Two times a day (BID) | INTRAVENOUS | Status: DC
  Administered 2018-01-28: 3 mL via INTRAVENOUS
  Administered 2018-01-28: 10 mL via INTRAVENOUS
  Administered 2018-01-29 – 2018-01-30 (×4): 3 mL via INTRAVENOUS

## 2018-01-27 MED ORDER — VANCOMYCIN HCL IN DEXTROSE 1-5 GM/200ML-% IV SOLN
INTRAVENOUS | Status: AC
  Filled 2018-01-27: qty 200

## 2018-01-27 MED ORDER — ACETAMINOPHEN 325 MG PO TABS
650.0000 mg | ORAL_TABLET | ORAL | Status: DC | PRN
  Administered 2018-01-28: 650 mg via ORAL
  Filled 2018-01-27: qty 2

## 2018-01-27 MED ORDER — HYDROXYZINE HCL 25 MG PO TABS: 25.0000 mg | ORAL_TABLET | Freq: Three times a day (TID) | ORAL | Status: DC | PRN

## 2018-01-27 MED ORDER — MENTHOL 3 MG MT LOZG: 1.0000 | LOZENGE | OROMUCOSAL | Status: DC | PRN

## 2018-01-27 SURGICAL SUPPLY — 89 items
APL SKNCLS STERI-STRIP NONHPOA (GAUZE/BANDAGES/DRESSINGS) ×2
BENZOIN TINCTURE PRP APPL 2/3 (GAUZE/BANDAGES/DRESSINGS) ×6 IMPLANT
BIT DRILL MOUNTAINEER FIX 14 (BIT) ×1
BIT DRILL MOUNTAINEER FIX 14MM (BIT) IMPLANT
BLADE CLIPPER SURG NEURO (BLADE) IMPLANT
BONE VIVIGEN FORMABLE 1.3CC (Bone Implant) IMPLANT
BONE VIVIGEN FORMABLE 5.4CC (Bone Implant) ×3 IMPLANT
BUR NEURO DRILL SOFT 3.0X3.8M (BURR) ×3 IMPLANT
BUR PRESCISION 1.7 ELITE (BURR) ×3 IMPLANT
CLOSURE WOUND 1/2 X4 (GAUZE/BANDAGES/DRESSINGS)
CONT SPEC 4OZ CLIKSEAL STRL BL (MISCELLANEOUS) ×3 IMPLANT
CORDS BIPOLAR (ELECTRODE) ×3 IMPLANT
COVER BACK TABLE 80X110 HD (DRAPES) ×3 IMPLANT
COVER SURGICAL LIGHT HANDLE (MISCELLANEOUS) ×3 IMPLANT
DRAIN CHANNEL 10F 3/8 F FF (DRAIN) IMPLANT
DRAIN CHANNEL 15F RND FF W/TCR (WOUND CARE) IMPLANT
DRAIN HEMOVAC 1/8 X 5 (WOUND CARE) IMPLANT
DRAPE C-ARM 42X72 X-RAY (DRAPES) ×2 IMPLANT
DRAPE HALF SHEET 40X57 (DRAPES) ×15 IMPLANT
DRAPE INCISE IOBAN 66X45 STRL (DRAPES) ×3 IMPLANT
DRAPE PED LAPAROTOMY (DRAPES) ×3 IMPLANT
DRAPE POUCH INSTRU U-SHP 10X18 (DRAPES) ×3 IMPLANT
DRAPE SURG 17X23 STRL (DRAPES) ×24 IMPLANT
DRAPE UNIVERSAL PACK (DRAPES) ×3 IMPLANT
DRILL BIT MOUNTAINEER FIX 14MM (BIT) ×3
DRSG MEPILEX BORDER 4X8 (GAUZE/BANDAGES/DRESSINGS) ×1 IMPLANT
DURAPREP 26ML APPLICATOR (WOUND CARE) ×3 IMPLANT
ELECT BLADE 4.0 EZ CLEAN MEGAD (MISCELLANEOUS) ×3
ELECT CAUTERY BLADE 6.4 (BLADE) ×3 IMPLANT
ELECT REM PT RETURN 9FT ADLT (ELECTROSURGICAL) ×3
ELECTRODE BLDE 4.0 EZ CLN MEGD (MISCELLANEOUS) ×1 IMPLANT
ELECTRODE REM PT RTRN 9FT ADLT (ELECTROSURGICAL) ×1 IMPLANT
EVACUATOR SILICONE 100CC (DRAIN) IMPLANT
GAUZE SPONGE 4X4 12PLY STRL (GAUZE/BANDAGES/DRESSINGS) ×3 IMPLANT
GAUZE SPONGE 4X4 16PLY XRAY LF (GAUZE/BANDAGES/DRESSINGS) ×12 IMPLANT
GLOVE BIO SURGEON STRL SZ7 (GLOVE) ×3 IMPLANT
GLOVE BIO SURGEON STRL SZ8 (GLOVE) ×3 IMPLANT
GLOVE BIOGEL PI IND STRL 7.5 (GLOVE) ×1 IMPLANT
GLOVE BIOGEL PI IND STRL 8 (GLOVE) ×1 IMPLANT
GLOVE BIOGEL PI INDICATOR 7.5 (GLOVE) ×2
GLOVE BIOGEL PI INDICATOR 8 (GLOVE) ×2
GOWN STRL REUS W/ TWL LRG LVL3 (GOWN DISPOSABLE) ×4 IMPLANT
GOWN STRL REUS W/ TWL XL LVL3 (GOWN DISPOSABLE) ×1 IMPLANT
GOWN STRL REUS W/TWL LRG LVL3 (GOWN DISPOSABLE) ×12
GOWN STRL REUS W/TWL XL LVL3 (GOWN DISPOSABLE) ×3
GRAFT BNE MATRIX VG FRMBL MD 5 (Bone Implant) IMPLANT
GRAFT BNE MATRIX VG FRMBL SM 1 (Bone Implant) IMPLANT
IV CATH 14GX2 1/4 (CATHETERS) ×3 IMPLANT
KIT BASIN OR (CUSTOM PROCEDURE TRAY) ×3 IMPLANT
KIT ROOM TURNOVER OR (KITS) ×3 IMPLANT
NDL HYPO 25GX1X1/2 BEV (NEEDLE) ×1 IMPLANT
NDL PRECISIONGLIDE 27X1.5 (NEEDLE) ×1 IMPLANT
NEEDLE HYPO 25GX1X1/2 BEV (NEEDLE) ×3 IMPLANT
NEEDLE PRECISIONGLIDE 27X1.5 (NEEDLE) ×3 IMPLANT
NS IRRIG 1000ML POUR BTL (IV SOLUTION) ×3 IMPLANT
PACK LAMINECTOMY ORTHO (CUSTOM PROCEDURE TRAY) ×3 IMPLANT
PAD ARMBOARD 7.5X6 YLW CONV (MISCELLANEOUS) ×6 IMPLANT
PATTIES SURGICAL .5 X.5 (GAUZE/BANDAGES/DRESSINGS) ×3 IMPLANT
PATTIES SURGICAL .5 X3 (DISPOSABLE) IMPLANT
PATTIES SURGICAL .5X1.5 (GAUZE/BANDAGES/DRESSINGS) ×3 IMPLANT
PIN MAYFIELD SKULL DISP (PIN) ×3 IMPLANT
PUTTY DBX 1CC (Putty) ×3 IMPLANT
PUTTY DBX 1CC DEPUY (Putty) IMPLANT
ROD MOUNTAINEER 120MM (Rod) ×2 IMPLANT
ROD TEMPLATE MOUNTAINEER 120MM (ROD) ×2 IMPLANT
SCREW F A 3.5X14 (Screw) ×8 IMPLANT
SCREW INNER (Screw) ×12 IMPLANT
SCREW MTN POLY 4.0X22MM (Screw) ×4 IMPLANT
SPONGE INTESTINAL PEANUT (DISPOSABLE) ×2 IMPLANT
SPONGE SURGIFOAM ABS GEL 100 (HEMOSTASIS) ×3 IMPLANT
STRIP CLOSURE SKIN 1/2X4 (GAUZE/BANDAGES/DRESSINGS) IMPLANT
SURGIFLO W/THROMBIN 8M KIT (HEMOSTASIS) IMPLANT
SUT MNCRL AB 4-0 PS2 18 (SUTURE) ×3 IMPLANT
SUT VIC AB 0 CT1 18XCR BRD 8 (SUTURE) ×1 IMPLANT
SUT VIC AB 0 CT1 8-18 (SUTURE) ×3
SUT VIC AB 1 CT1 18XCR BRD 8 (SUTURE) ×2 IMPLANT
SUT VIC AB 1 CT1 8-18 (SUTURE) ×6
SUT VIC AB 2-0 CT2 18 VCP726D (SUTURE) ×3 IMPLANT
SYR BULB IRRIGATION 50ML (SYRINGE) ×3 IMPLANT
SYR CONTROL 10ML LL (SYRINGE) ×3 IMPLANT
TAP MOUNTAINEER 3MM (TAP) ×2 IMPLANT
TAP SURG 3.5 MOUNTAINEER (BIT) ×2 IMPLANT
TAPE CLOTH 4X10 WHT NS (GAUZE/BANDAGES/DRESSINGS) ×3 IMPLANT
TAPE CLOTH SURG 4X10 WHT LF (GAUZE/BANDAGES/DRESSINGS) ×2 IMPLANT
TOWEL OR 17X24 6PK STRL BLUE (TOWEL DISPOSABLE) ×3 IMPLANT
TOWEL OR 17X26 10 PK STRL BLUE (TOWEL DISPOSABLE) ×3 IMPLANT
TRAY FOLEY W/METER SILVER 16FR (SET/KITS/TRAYS/PACK) ×3 IMPLANT
WATER STERILE IRR 1000ML POUR (IV SOLUTION) ×3 IMPLANT
YANKAUER SUCT BULB TIP NO VENT (SUCTIONS) ×3 IMPLANT

## 2018-01-27 NOTE — Progress Notes (Addendum)
Paged office [phone] regarding pts daily meds to be reordered (post op orders in system--pain meds, etc). Paged office second time [phone]. Return call [place] at office [phone]-- per office MD is still in OR, office has left a voicemail and I have now left voicemail. MD called with voicemail left again [phone]. Waiting for return call/orders. MD returned call [phone], reordered with exception of refalen, new orders placed for benadryl.  Continue to monitor patient.

## 2018-01-27 NOTE — H&P (Signed)
Patient tolerated stage 1 of her 2 staged procedure yesterday. She presents today for stage 2, specifically, a posterior spinal fusion with instrumentation and allograft spanning C4-C7

## 2018-01-27 NOTE — Progress Notes (Signed)
Pharmacy Antibiotic Note  Pharmacy consulted to dose vancomycin for surgical prophylaxis. Patient has no drain in place. Will order vancomycin 1 gm IV once 12 hours post previous dose and sign off.   Vinnie LevelBenjamin Nechelle Petrizzo, PharmD., BCPS Clinical Pharmacist Pager [phone]

## 2018-01-27 NOTE — Op Note (Signed)
NAME:  Alison Kim, Alison Kim                   ACCOUNT NO.:  MEDICAL RECORD NO.:  [id number]  PHYSICIAN:  Estill Bamberg, MD      DATE OF BIRTH:  [birth year]  DATE OF PROCEDURE:  01/26/2018                              OPERATIVE REPORT   PREOPERATIVE DIAGNOSES: 1. Left-sided cervical radiculopathy. 2. Cervical nonunion, status post a previous cervical fusion     procedure.  POSTOPERATIVE DIAGNOSES: 1. Left-sided cervical radiculopathy. 2. Cervical nonunion, status post a previous cervical fusion     procedure.  PROCEDURES:  (Stage I of 2) 1. Anterior cervical decompression and fusion, C4-5. 2. Placement of anterior instrumentation, C4-5. 3. Insertion of interbody device x1 (Titan intervertebral spacer). 4. Use of morselized allograft - ViviGen. 5. Intraoperative use of fluoroscopy. 6. Exploration of spinal fusion at the C5-6 level, notable for     nonunion.  SURGEON:  Estill Bamberg, MD  ASSISTANT:  None.  ANESTHESIA:  General endotracheal anesthesia.  COMPLICATIONS:  None.  DISPOSITION:  Stable.  ESTIMATED BLOOD LOSS:  Minimal.  INDICATIONS FOR SURGERY:  Briefly, Ms. Alison Kim is a 52 year old female, who did present to me with pain in her left arm as well as ongoing pain in her neck.  Of note, she is status post a previous C5-6 and C6-7 ACDF procedure by another provider.  I did obtain a CAT scan, which was suggestive of a nonunion at C5-6.  An MRI did reveal left-sided neuroforaminal compression at C4-5 as well.  The patient's pain was consistent with cervical radiculopathy and a nonunion.  We did discuss proceeding with a staged procedure, specifically, an ACDF procedure at C4-5, followed by a posterior fusion procedure from C4-C7 on the following day.  The patient did wish to proceed.  OPERATIVE DETAILS:  On January 26, 2018, the patient was brought to Surgery and general endotracheal anesthesia was administered.  The patient was placed supine on the hospital bed.  All  bony prominences were meticulously padded.  The neck was prepped and draped in the usual sterile fashion and a time-out procedure was performed.  At this point, a left-sided transverse incision was made overlying the C4-5 intervertebral space.  A Smith-Robinson approach was used and the anterior spine was noted.  The previously placed C5-6 instrumentation was removed uneventfully.  I then placed Caspar pins into the C4 and C5 vertebral bodies and distraction was gently applied.  I then proceeded with a thorough and complete C4-5 intervertebral diskectomy.  I was able to thoroughly decompress the spinal canal in the right and left neural foramina.  I was very pleased with the decompression.  The endplates were then appropriately prepared and the appropriate-sized intervertebral spacer was packed with ViviGen and tamped into position in the usual fashion.  I was very pleased with the press-fit of the implant.  Distraction was then discontinued  at this point and a 12-mm anterior cervical plate was placed over the anterior spine.  14-mm variable angle screws were placed, 2 in each vertebral body at C4 and C5 for a total of 4 vertebral body screws.  The screws were then locked to the plate using the Cam locking mechanism.  I was very pleased with the final fluoroscopic images.  At this point, the wound was closed in layers, specifically 2-0 Vicryl followed by  4-0 Monocryl.  Benzoin and Steri-Strips were applied followed by sterile dressing.  All instrument counts were correct at the termination of the procedure.  Of note, the plan postoperatively is for the patient to return to Surgery on the following day for stage 2 of her procedure, specifically, a posterior spinal fusion with instrumentation.     Estill BambergMark Mirren Gest, MD     MD/MEDQ  D:  01/26/2018  T:  01/26/2018  Job:  [id number]
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
## 2018-01-27 NOTE — Anesthesia Procedure Notes (Signed)
Procedure Name: Intubation Date/Time: 01/27/2018 7:50 AM Performed by: Rudi RummageLowder, Berline Semrad J, CRNA Pre-anesthesia Checklist: Patient identified, Emergency Drugs available, Timeout performed, Patient being monitored and Suction available Patient Re-evaluated:Patient Re-evaluated prior to induction Oxygen Delivery Method: Circle system utilized Preoxygenation: Pre-oxygenation with 100% oxygen Induction Type: IV induction Ventilation: Mask ventilation without difficulty Laryngoscope Size: Glidescope Tube type: Oral Tube size: 7.0 mm Number of attempts: 1 Airway Equipment and Method: Stylet Placement Confirmation: ETT inserted through vocal cords under direct vision,  positive ETCO2 and breath sounds checked- equal and bilateral Secured at: 22 cm Tube secured with: Tape Dental Injury: Teeth and Oropharynx as per pre-operative assessment

## 2018-01-27 NOTE — Transfer of Care (Signed)
Immediate Anesthesia Transfer of Care Note  Patient: Alison Kim  Procedure(s) Performed: POSTERIOR SPINAL FUSION, CERVICAL 4-7 WITH INSTRUMENTATION AND ALLOGRAFT REQUESTED TIME 3. 5 HRS (N/A )  Patient Location: PACU  Anesthesia Type:General  Level of Consciousness: awake  Airway & Oxygen Therapy: Patient Spontanous Breathing and Patient connected to nasal cannula oxygen  Post-op Assessment: Report given to RN and Post -op Vital signs reviewed and stable  Post vital signs: Reviewed and stable  Last Vitals:  Vitals:   01/27/18 0321 01/27/18 1102  BP: 118/81 (!) 137/92  Pulse: 73 (!) 106  Resp:  (!) 21  Temp: 36.8 C 36.7 C  SpO2: 97% 94%    Last Pain:  Vitals:   01/27/18 1102  TempSrc:   PainSc: 6       Patients Stated Pain Goal: 3 (01/27/18 0100)  Complications: No apparent anesthesia complications

## 2018-01-27 NOTE — Anesthesia Postprocedure Evaluation (Signed)
Anesthesia Post Note  Patient: Nelva NayFrances M Sidener  Procedure(s) Performed: POSTERIOR SPINAL FUSION, CERVICAL 4-7 WITH INSTRUMENTATION AND ALLOGRAFT REQUESTED TIME 3. 5 HRS (N/A )     Patient location during evaluation: PACU Anesthesia Type: General Level of consciousness: awake and alert Pain management: pain level controlled Vital Signs Assessment: post-procedure vital signs reviewed and stable Respiratory status: spontaneous breathing, nonlabored ventilation, respiratory function stable and patient connected to nasal cannula oxygen Cardiovascular status: blood pressure returned to baseline and stable Postop Assessment: no apparent nausea or vomiting Anesthetic complications: no    Last Vitals:  Vitals:   01/27/18 1220 01/27/18 1332  BP: 109/71 (!) 151/82  Pulse: 87 87  Resp: (!) 21 19  Temp: 36.9 C 36.7 C  SpO2: 92% 97%    Last Pain:  Vitals:   01/27/18 1441  TempSrc:   PainSc: 8                  Ryan P Ellender

## 2018-01-28 ENCOUNTER — Inpatient Hospital Stay (HOSPITAL_COMMUNITY): Payer: Medicare Other

## 2018-01-28 ENCOUNTER — Encounter (HOSPITAL_COMMUNITY): Payer: Self-pay | Admitting: Internal Medicine

## 2018-01-28 DIAGNOSIS — E119 Type 2 diabetes mellitus without complications: Secondary | ICD-10-CM

## 2018-01-28 DIAGNOSIS — R000 Tachycardia, unspecified: Secondary | ICD-10-CM | POA: Diagnosis not present

## 2018-01-28 DIAGNOSIS — F17200 Nicotine dependence, unspecified, uncomplicated: Secondary | ICD-10-CM | POA: Diagnosis present

## 2018-01-28 DIAGNOSIS — J449 Chronic obstructive pulmonary disease, unspecified: Secondary | ICD-10-CM | POA: Diagnosis present

## 2018-01-28 DIAGNOSIS — R079 Chest pain, unspecified: Secondary | ICD-10-CM | POA: Diagnosis not present

## 2018-01-28 DIAGNOSIS — R0902 Hypoxemia: Secondary | ICD-10-CM | POA: Diagnosis not present

## 2018-01-28 DIAGNOSIS — I10 Essential (primary) hypertension: Secondary | ICD-10-CM | POA: Diagnosis present

## 2018-01-28 LAB — BASIC METABOLIC PANEL
ANION GAP: 13 (ref 5–15)
BUN: 5 mg/dL — ABNORMAL LOW (ref 6–20)
CALCIUM: 9.4 mg/dL (ref 8.9–10.3)
CO2: 23 mmol/L (ref 22–32)
Chloride: 98 mmol/L — ABNORMAL LOW (ref 101–111)
Creatinine, Ser: 0.74 mg/dL (ref 0.44–1.00)
GFR calc non Af Amer: >60 mL/min (ref >60)
Glucose, Bld: 163 mg/dL — ABNORMAL HIGH (ref 65–99)
POTASSIUM: 3.6 mmol/L (ref 3.5–5.1)
Sodium: 134 mmol/L — ABNORMAL LOW (ref 135–145)

## 2018-01-28 LAB — CBC
HEMATOCRIT: 37.1 % (ref 36.0–46.0)
Hemoglobin: 11.9 g/dL — ABNORMAL LOW (ref 12.0–15.0)
MCH: 29.3 pg (ref 26.0–34.0)
MCHC: 32.1 g/dL (ref 30.0–36.0)
MCV: 91.4 fL (ref 78.0–100.0)
PLATELETS: 297 10*3/uL (ref 150–400)
RBC: 4.06 MIL/uL (ref 3.87–5.11)
RDW: 13.9 % (ref 11.5–15.5)
WBC: 12.2 10*3/uL — AB (ref 4.0–10.5)

## 2018-01-28 LAB — TROPONIN I: Troponin I: <0.03 ng/mL (ref <0.03)

## 2018-01-28 LAB — GLUCOSE, CAPILLARY: Glucose-Capillary: 141 mg/dL — ABNORMAL HIGH (ref 65–99)

## 2018-01-28 MED ORDER — INSULIN ASPART 100 UNIT/ML ~~LOC~~ SOLN
0.0000 [IU] | Freq: Three times a day (TID) | SUBCUTANEOUS | Status: DC
  Administered 2018-01-29: 5 [IU] via SUBCUTANEOUS
  Administered 2018-01-29: 2 [IU] via SUBCUTANEOUS
  Administered 2018-01-29: 3 [IU] via SUBCUTANEOUS
  Administered 2018-01-30: 2 [IU] via SUBCUTANEOUS
  Administered 2018-01-30: 3 [IU] via SUBCUTANEOUS
  Administered 2018-01-30: 1 [IU] via SUBCUTANEOUS
  Administered 2018-01-31: 2 [IU] via SUBCUTANEOUS

## 2018-01-28 MED ORDER — SODIUM CHLORIDE 0.9 % IV SOLN
INTRAVENOUS | Status: AC
  Administered 2018-01-28 – 2018-01-29 (×2): via INTRAVENOUS

## 2018-01-28 MED ORDER — OXYCODONE-ACETAMINOPHEN 5-325 MG PO TABS
1.0000 | ORAL_TABLET | ORAL | Status: DC | PRN
Start: 2018-01-28 — End: 2018-01-28

## 2018-01-28 MED ORDER — IOPAMIDOL (ISOVUE-370) INJECTION 76%
INTRAVENOUS | Status: AC
  Administered 2018-01-28: 100 mL
  Filled 2018-01-28: qty 100

## 2018-01-28 MED ORDER — OXYCODONE HCL ER 10 MG PO T12A
10.0000 mg | EXTENDED_RELEASE_TABLET | Freq: Two times a day (BID) | ORAL | Status: DC
  Administered 2018-01-28 – 2018-01-31 (×7): 10 mg via ORAL
  Filled 2018-01-28 (×7): qty 1

## 2018-01-28 MED ORDER — GI COCKTAIL ~~LOC~~
30.0000 mL | Freq: Once | ORAL | Status: AC
  Administered 2018-01-28: 30 mL via ORAL
  Filled 2018-01-28: qty 30

## 2018-01-28 MED ORDER — LISINOPRIL 20 MG PO TABS: 20.0000 mg | ORAL_TABLET | Freq: Every day | ORAL | Status: DC

## 2018-01-28 MED ORDER — METHOCARBAMOL 500 MG PO TABS
500.0000 mg | ORAL_TABLET | Freq: Four times a day (QID) | ORAL | Status: DC | PRN
  Administered 2018-01-28 – 2018-01-31 (×4): 500 mg via ORAL
  Filled 2018-01-28 (×4): qty 1

## 2018-01-28 MED ORDER — HYDROMORPHONE HCL 1 MG/ML IJ SOLN: 0.5000 mg | INTRAMUSCULAR | Status: DC | PRN

## 2018-01-28 MED ORDER — IPRATROPIUM-ALBUTEROL 0.5-2.5 (3) MG/3ML IN SOLN
3.0000 mL | RESPIRATORY_TRACT | Status: DC
  Filled 2018-01-28: qty 3

## 2018-01-28 MED ORDER — HYDRALAZINE HCL 20 MG/ML IJ SOLN
10.0000 mg | Freq: Three times a day (TID) | INTRAMUSCULAR | Status: DC | PRN
  Administered 2018-01-28 – 2018-01-29 (×2): 10 mg via INTRAVENOUS
  Filled 2018-01-28 (×2): qty 1

## 2018-01-28 MED ORDER — OXYCODONE-ACETAMINOPHEN 5-325 MG PO TABS
1.0000 | ORAL_TABLET | Freq: Four times a day (QID) | ORAL | Status: DC | PRN
  Administered 2018-01-28 – 2018-01-31 (×6): 2 via ORAL
  Filled 2018-01-28 (×6): qty 2

## 2018-01-28 MED ORDER — ALPRAZOLAM 0.25 MG PO TABS
0.2500 mg | ORAL_TABLET | Freq: Two times a day (BID) | ORAL | Status: DC | PRN
  Administered 2018-01-28 – 2018-01-30 (×5): 0.25 mg via ORAL
  Filled 2018-01-28 (×5): qty 1

## 2018-01-28 MED FILL — Heparin Sodium (Porcine) Inj 1000 Unit/ML: INTRAMUSCULAR | Qty: 30 | Status: AC

## 2018-01-28 MED FILL — Sodium Chloride IV Soln 0.9%: INTRAVENOUS | Qty: 1000 | Status: AC
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
## 2018-01-28 NOTE — Progress Notes (Signed)
Of note, I was informed by the patient's nurse that she has been complaining of a sharp stabbing pain at the right side of her chest. The patient's vital signs are presently stable, and she is saturating well on 2 L of oxygen via nasal cannula.   Immediately upon obtaining this information, I did page the on-call physician for the [facility] hospitalist service.  She did express to me that she will evaluate the patient.  I do look forward to her input on Ms. Dado's chest pain.

## 2018-01-28 NOTE — Progress Notes (Signed)
[facility] this shift so far with assist X1 while wearing her cervical collar. Patient tolerated well. Daughter remains at the bedside and is supportive of patient. Bed alarm used this shift for safety.

## 2018-01-28 NOTE — Progress Notes (Signed)
Patient said that her L arm is feeling better now. No redness or swelling noted. Patient is encouraged to keep L arm on pillow. Will continue to monitor.
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
## 2018-01-28 NOTE — Op Note (Signed)
NAME:  Alison Kim, Alison Kim                   ACCOUNT NO.:  MEDICAL RECORD NO.:  [id number]  PHYSICIAN:  Estill Bamberg, MD      DATE OF BIRTH:  1966-02-13  DATE OF PROCEDURE:  01/27/2018                              OPERATIVE REPORT   PREOPERATIVE DIAGNOSES: 1. Left-sided cervical radiculopathy. 2. Cervical nonunion. 3. One day status post ACDF, requiring a posterior fusion and     instrumentation procedure.  POSTOPERATIVE DIAGNOSES: 1. Left-sided cervical radiculopathy. 2. Cervical nonunion. 3. One day status post ACDF, requiring a posterior fusion and     instrumentation procedure.  PROCEDURE: 1. Posterior spinal fusion, C4-5, C5-6, C6-7. 2. Placement of segmental instrumentation spanning C4-C7. 3. Bilateral C6-7 laminotomy with partial facetectomy and     foraminotomy. 4. Use of local autograft. 5. Use of morselized allograft - ViviGen. 6. Intraoperative use of fluoroscopy.  SURGEON:  Estill Bamberg, MD.  ASSISTANT:  None.  ANESTHESIA:  General endotracheal anesthesia.  COMPLICATIONS:  None.  DISPOSITION:  Stable.  ESTIMATED BLOOD LOSS:  Minimal.  INDICATIONS FOR SURGERY:  Briefly, Alison Kim is a 52 year old female, who did have pain in her left arm and neck pain.  She was diagnosed with cervical radiculopathy and a nonunion.  Please refer to my operative report dated January 26, 2018, for full account of the patient's history.  The patient did wish to proceed with surgery.  Specifically, today was stage II of a 2-staged procedure.  OPERATIVE DETAILS:  On January 27, 2018, the patient was brought to surgery and general endotracheal anesthesia was administered.  A [place] head holder was placed by me.  The patient was then rolled into the prone position.  The head was appropriately positioned and secured into the [place] head holder attachment.  All bony prominences were padded.  The patient's arms were secured to her sides.  The neck was then prepped and  draped in the usual sterile fashion.  A time-out procedure was performed.  A midline incision was then made over the posterior cervical spine.  The fascia was incised at the midline.  Self- retaining retractors were placed.  The lamina from C4-C5 was identified and subperiosteally exposed.  I then used a high-speed bur to decorticate the facet joints at C4-5, C5-6, and C6-7 bilaterally.  I then used a 2.5 mm drill, followed by a 3 mm tap to cannulate the lateral masses of C4 and C5 bilaterally using a superior and lateral trajectory.  I then performed bilateral partial facetectomies and foraminotomies at the C6-7 level.  This did decompress the neuro foramen on the right and left sides, and did allow me to palpate the superior and medial aspect of the C7 pedicles using a nerve hook.  I then cannulated the C7 pedicles bilaterally, using a gearshift probe, followed by a 3.5 mm tap.  A ball-tip probe was used to confirm that there was no cortical violation.  At this point, a 3 mm high-speed bur was used to decorticate the posterior elements from C4-C7 bilaterally. DBX putty was then packed into the facet joints at C4-5, C5-6, and C6-7 bilaterally.  At this point, a 4 x 22 mm pedicle screws were placed into the C7 pedicles on the right and left sides.  I then placed a 3.5 x 14 mm lateral  mass screws bilaterally at C4 and C5 bilaterally.  ViviGen was then packed into the posterior elements from C4-C7.  Rod was then cut to the appropriate length and contoured into the appropriate degree of lordosis and secured into the tulip heads of the screws on the right and left sides.  Caps were placed and a final locking procedure was performed.  I did copiously irrigate the wound prior to placing the bone graft.  The wound was then closed in layers using #1 Vicryl, followed by 2-0 Vicryl, followed by 4-0 Monocryl.  Benzoin and Steri-Strips were applied, followed by sterile dressing.  All instrument counts  were correct at the termination of the procedure.  The [place] headholder was then removed by me after she was rolled into the supine position after her surgery.   Estill BambergMark Brylan Seubert, MD     MD/MEDQ  D:  01/27/2018  T:  01/27/2018  Job:  [id number]

## 2018-01-28 NOTE — Progress Notes (Signed)
I have reviewed Thresa RossKaren Black's and Dr. Jon BillingsBelkys Regaldo's notes, and I appreciate their input. Will await results of labs and CT angio. Of note, there is no reason to discuss this patient's management with neurosurgery. If CT angio is positive, I would instead request that Dr. Sunnie Nielsenegalado discuss anticoagulation with me, since I performed the patient's two very recent spinal surgeries.

## 2018-01-28 NOTE — Progress Notes (Signed)
Patient c/o pain from her IV in her L arm. IV site is discontinued and new IV started in the R arm, see IV charting in flow sheets. L arm is elevated on pillow. Will cont to monitor.

## 2018-01-28 NOTE — Progress Notes (Signed)
Patient starting complaining of a sudden sharp stabbing right sided chest pain. She said it had been going on for about 20 minutes prior to her telling me about it. Her vital signs are stable. I paged Dr. Yevette Edwardsumonski to inform him, he said to get a 12 lead EKG and he was going to page the medical doctors to come take a look at her.
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
## 2018-01-28 NOTE — Care Management Note (Signed)
Case Management Note  Patient Details  Name: Alison Kim MRN: [id number] Date of Birth: 08/16/1966  Subjective/Objective:    This 52 yo female underwent stage 1 ACDF, C4-5 on 01/26/18 and stage 2 posterior spinal fusion C4-C7 01/27/18.  PTA, pt independent, lives at home with husband.                  Action/Plan: PT/OT recommending no OP follow up, but pt feels she needs RW for home.  Referral to [facility] for RW, to be delivered to pt's room prior to dc.    Expected Discharge Date:  01/29/18               Expected Discharge Plan:  Home/Self Care  In-House Referral:     Discharge planning Services  CM Consult  Post Acute Care Choice:    Choice offered to:     DME Arranged:  [facility] D[facility] Agency:  [facility]  HH Arranged:    [facility] Agency:     Status of Service:  Completed, signed off  If discussed at Long Length of Stay Meetings, dates discussed:    Additional Comments:  Quintella BatonJulie W. Vernita Tague, RN, BSN  Trauma/Neuro ICU Case Manager [phone]

## 2018-01-28 NOTE — Progress Notes (Signed)
    Patient c/o posterior neck pain Per daughter, she falls asleep when given IV dilaudid Has been minimally mobile per daughter   Physical Exam: Vitals:   01/28/18 0430 01/28/18 0700  BP:    Pulse:  95  Resp:    Temp: 99.1 F (37.3 C)   SpO2:      Dressing in place NVI Neck soft/supple  POD s/p A/P C4-C7 fusion, doing well with expected postop neck pain  - up with PT/OT, encourage ambulation - Percocet for pain, Valium for muscle spasm, and will add 10mg  oxycontin BID - likely d/c home tomorrow with f/u in 2 weeks

## 2018-01-29 DIAGNOSIS — R0781 Pleurodynia: Secondary | ICD-10-CM

## 2018-01-29 DIAGNOSIS — J690 Pneumonitis due to inhalation of food and vomit: Secondary | ICD-10-CM

## 2018-01-29 LAB — GLUCOSE, CAPILLARY
GLUCOSE-CAPILLARY: 162 mg/dL — AB (ref 65–99)
GLUCOSE-CAPILLARY: 228 mg/dL — AB (ref 65–99)
GLUCOSE-CAPILLARY: 113 mg/dL — AB (ref 65–99)
Glucose-Capillary: 203 mg/dL — ABNORMAL HIGH (ref 65–99)

## 2018-01-29 LAB — TROPONIN I

## 2018-01-29 LAB — HIV ANTIBODY (ROUTINE TESTING W REFLEX): HIV Screen 4th Generation wRfx: NONREACTIVE

## 2018-01-29 MED ORDER — AMPICILLIN-SULBACTAM SODIUM 3 (2-1) G IJ SOLR
3.0000 g | Freq: Four times a day (QID) | INTRAMUSCULAR | Status: DC
  Administered 2018-01-29 – 2018-01-31 (×8): 3 g via INTRAVENOUS
  Filled 2018-01-29 (×12): qty 3

## 2018-01-29 MED ORDER — BISACODYL 5 MG PO TBEC
5.0000 mg | DELAYED_RELEASE_TABLET | Freq: Every day | ORAL | Status: DC | PRN
  Administered 2018-01-29: 5 mg via ORAL
  Filled 2018-01-29: qty 1

## 2018-01-29 MED ORDER — DOCUSATE SODIUM 100 MG PO CAPS: 100.0000 mg | ORAL_CAPSULE | Freq: Every day | ORAL | Status: DC | PRN

## 2018-01-29 MED ORDER — AMLODIPINE BESYLATE 5 MG PO TABS
5.0000 mg | ORAL_TABLET | Freq: Every day | ORAL | Status: DC
  Administered 2018-01-29 – 2018-01-31 (×3): 5 mg via ORAL
  Filled 2018-01-29 (×3): qty 1

## 2018-01-29 MED ORDER — IPRATROPIUM-ALBUTEROL 0.5-2.5 (3) MG/3ML IN SOLN
3.0000 mL | Freq: Three times a day (TID) | RESPIRATORY_TRACT | Status: DC
  Administered 2018-01-29 – 2018-01-31 (×6): 3 mL via RESPIRATORY_TRACT
  Filled 2018-01-29 (×7): qty 3
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
## 2018-01-29 NOTE — Progress Notes (Addendum)
    Patient c/o posterior neck pain, chest pain from yesterday improved, presently on Unasyn for suspected lung infection yday CT angio of chest was neg for PE, and troponins were not elevated. Has not been especially mobile, and IS was across the room on the counter upon my entry into the room   Physical Exam: Vitals:   01/29/18 0700 01/29/18 0849  BP:  130/86  Pulse:    Resp:    Temp: 98.4 F (36.9 C)   SpO2:  92%  WBC 12.4  Dressing in place NVI Neck soft/supple  s/p A/P C4-C7 fusion, doing well with expected postop neck pain, but with possible lung infection  - up with PT/OT, encourage ambulation and incentive spirometry - Percocet and oxycontin 10mg  BID for pain, and benadryl for itching - possible d/c home tomorrow on oral antibiotics with f/u in 2 weeks -appreciate hospitalists management of her chest pain  Neil Crouchave Ricardo Schubach, MD Mobile [phone]

## 2018-01-29 NOTE — Progress Notes (Signed)
1900: Handoff report received from RN. Pt resting in bed with family visiting. Discussed plan of care for the shift; pt amenable to plan.  2000: Pt with last BM PTA (2/13). [facility] paged for PRN laxative.  0000: Pt resting comfortably.  0400: Pt continues resting comfortably.  0700: Handoff report given to RN. No acute events overnight.
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
## 2018-01-30 LAB — TYPE AND SCREEN
ABO/RH(D): O POS
Antibody Screen: NEGATIVE
DONOR AG TYPE: NEGATIVE
Donor AG Type: NEGATIVE
UNIT DIVISION: 0
UNIT DIVISION: 0

## 2018-01-30 LAB — CBC
HCT: 34.8 % — ABNORMAL LOW (ref 36.0–46.0)
HEMOGLOBIN: 11.1 g/dL — AB (ref 12.0–15.0)
MCH: 29.1 pg (ref 26.0–34.0)
MCHC: 31.9 g/dL (ref 30.0–36.0)
MCV: 91.1 fL (ref 78.0–100.0)
Platelets: 273 10*3/uL (ref 150–400)
RBC: 3.82 MIL/uL — AB (ref 3.87–5.11)
RDW: 13.9 % (ref 11.5–15.5)
WBC: 10.7 10*3/uL — AB (ref 4.0–10.5)

## 2018-01-30 LAB — BPAM RBC
Blood Product Expiration Date: 201903142359
Blood Product Expiration Date: 201903152359
Unit Type and Rh: 5100
Unit Type and Rh: 5100

## 2018-01-30 LAB — BASIC METABOLIC PANEL
Anion gap: 10 (ref 5–15)
BUN: 6 mg/dL (ref 6–20)
CHLORIDE: 101 mmol/L (ref 101–111)
CO2: 25 mmol/L (ref 22–32)
CREATININE: 0.85 mg/dL (ref 0.44–1.00)
Calcium: 9.2 mg/dL (ref 8.9–10.3)
GFR calc non Af Amer: >60 mL/min (ref >60)
Glucose, Bld: 161 mg/dL — ABNORMAL HIGH (ref 65–99)
Potassium: 3.6 mmol/L (ref 3.5–5.1)
SODIUM: 136 mmol/L (ref 135–145)

## 2018-01-30 LAB — GLUCOSE, CAPILLARY
GLUCOSE-CAPILLARY: 225 mg/dL — AB (ref 65–99)
GLUCOSE-CAPILLARY: 167 mg/dL — AB (ref 65–99)
GLUCOSE-CAPILLARY: 132 mg/dL — AB (ref 65–99)

## 2018-01-30 MED ORDER — SENNA 8.6 MG PO TABS
1.0000 | ORAL_TABLET | Freq: Every day | ORAL | Status: DC
  Administered 2018-01-30 – 2018-01-31 (×2): 8.6 mg via ORAL
  Filled 2018-01-30 (×2): qty 1

## 2018-01-30 MED ORDER — BISACODYL 10 MG RE SUPP: 10.0000 mg | Freq: Every day | RECTAL | Status: DC | PRN

## 2018-01-30 MED ORDER — POLYETHYLENE GLYCOL 3350 17 G PO PACK
17.0000 g | PACK | Freq: Two times a day (BID) | ORAL | Status: DC
  Administered 2018-01-30 – 2018-01-31 (×3): 17 g via ORAL
  Filled 2018-01-30 (×3): qty 1

## 2018-01-30 MED ORDER — AMOXICILLIN-POT CLAVULANATE 875-125 MG PO TABS: 1.0000 | ORAL_TABLET | Freq: Two times a day (BID) | ORAL | 0 refills | Status: AC

## 2018-01-30 MED ORDER — BISACODYL 10 MG RE SUPP
10.0000 mg | Freq: Once | RECTAL | Status: DC
  Filled 2018-01-30: qty 1

## 2018-01-30 MED ORDER — AMOXICILLIN-POT CLAVULANATE 875-125 MG PO TABS: 1.0000 | ORAL_TABLET | Freq: Two times a day (BID) | ORAL | 0 refills | Status: DC
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
## 2018-01-30 NOTE — Progress Notes (Addendum)
Spoke with Dr.  Sunnie Nielsenegalado and patient regarding plan for lung care.   Dr. Sunnie Nielsenegalado prefers for patient to stay until AM, at which time she can be switched to Augmentin for 5 more days of treatment.   Patient has made arrangements for discharge to home in AM. Will arrange for this to occur. Dr. Yevette Edwardsumonski made aware.
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
## 2018-01-30 NOTE — Progress Notes (Signed)
    Patient c/o a couple of posterior headaches y'day, chest pain improved, ability to deep breathe better, but after a few deep breaths gets SOB.  Now with productive cough.  Presently on Unasyn for suspected lung infection CT angio of chest was neg for PE, and troponins were not elevated. Was up at bathroom sink, brushing teeth. No BM since preop   Physical Exam: Vitals:   01/30/18 0300 01/30/18 0700  BP: 136/81   Pulse: (!) 109   Resp: 18   Temp: 98.2 F (36.8 C) 98.7 F (37.1 C)  SpO2: 93%   WBC from 12.4 to 10.7 today  Dressing in place NVI Neck soft/supple  s/p A/P C4-C7 fusion, doing well with expected postop neck pain, but with possible lung infection  - up with PT/OT, encourage ambulation and incentive spirometry - Percocet and oxycontin 10mg  BID for pain, and benadryl for itching -suppository today to try to achieve BM - ready from ortho POV for d/c home, but need to coordinate with medicine regarding chest  Neil Crouchave Trevone Prestwood, MD Mobile [phone]
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
## 2018-01-31 ENCOUNTER — Encounter (HOSPITAL_COMMUNITY): Payer: Self-pay | Admitting: Orthopedic Surgery

## 2018-01-31 DIAGNOSIS — R0902 Hypoxemia: Secondary | ICD-10-CM

## 2018-01-31 LAB — GLUCOSE, CAPILLARY
GLUCOSE-CAPILLARY: 128 mg/dL — AB (ref 65–99)
Glucose-Capillary: 163 mg/dL — ABNORMAL HIGH (ref 65–99)

## 2018-01-31 MED ORDER — HYDROXYZINE HCL 25 MG PO TABS
25.0000 mg | ORAL_TABLET | Freq: Three times a day (TID) | ORAL | Status: DC | PRN
  Administered 2018-01-31: 25 mg via ORAL
  Filled 2018-01-31: qty 1
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
## 2018-01-31 NOTE — Progress Notes (Signed)
Patient comfortable. WBC count trending down. Afebrile. Pain well controlled. Will d/c today as planned yesterday with f/u scheduled for next week.

## 2018-01-31 NOTE — Care Management Important Message (Signed)
Important Message  Patient Details  Name: Alison Kim MRN: [id number] Date of Birth: 1966/04/19   Medicare Important Message Given:  Yes    Dorena BodoIris Deztinee Lohmeyer 01/31/2018, 12:39 PM

## 2018-01-31 NOTE — Care Management Important Message (Signed)
Important Message  Patient Details  Name: Nelva NayFrances M Buccellato MRN: [id number] Date of Birth: 29-May-1966   Medicare Important Message Given:  Yes    Elliot CousinShavis, Euriah Matlack Ellen, RN 01/31/2018, 10:12 AM

## 2018-03-14 ENCOUNTER — Encounter (HOSPITAL_COMMUNITY): Payer: Self-pay | Admitting: Orthopedic Surgery

## 2018-07-02 ENCOUNTER — Other Ambulatory Visit: Payer: Self-pay

## 2018-07-02 ENCOUNTER — Emergency Department (HOSPITAL_COMMUNITY): Payer: Medicare Other

## 2018-07-02 ENCOUNTER — Encounter (HOSPITAL_COMMUNITY): Payer: Self-pay

## 2018-07-02 ENCOUNTER — Emergency Department (HOSPITAL_COMMUNITY)
Admission: EM | Admit: 2018-07-02 | Discharge: 2018-07-02 | Disposition: A | Discharge status: Home / Self Care | Payer: Medicare Other | Attending: Emergency Medicine | Admitting: Emergency Medicine

## 2018-07-02 DIAGNOSIS — E119 Type 2 diabetes mellitus without complications: Secondary | ICD-10-CM | POA: Insufficient documentation

## 2018-07-02 DIAGNOSIS — F17210 Nicotine dependence, cigarettes, uncomplicated: Secondary | ICD-10-CM | POA: Insufficient documentation

## 2018-07-02 DIAGNOSIS — R55 Syncope and collapse: Secondary | ICD-10-CM | POA: Diagnosis not present

## 2018-07-02 DIAGNOSIS — L7682 Other postprocedural complications of skin and subcutaneous tissue: Secondary | ICD-10-CM | POA: Diagnosis not present

## 2018-07-02 DIAGNOSIS — Z7984 Long term (current) use of oral hypoglycemic drugs: Secondary | ICD-10-CM | POA: Diagnosis not present

## 2018-07-02 DIAGNOSIS — G8918 Other acute postprocedural pain: Secondary | ICD-10-CM | POA: Diagnosis present

## 2018-07-02 DIAGNOSIS — I10 Essential (primary) hypertension: Secondary | ICD-10-CM | POA: Diagnosis not present

## 2018-07-02 DIAGNOSIS — Z79899 Other long term (current) drug therapy: Secondary | ICD-10-CM | POA: Insufficient documentation

## 2018-07-02 LAB — COMPREHENSIVE METABOLIC PANEL
ALT: 14 U/L (ref 0–44)
ANION GAP: 5 (ref 5–15)
AST: 14 U/L — ABNORMAL LOW (ref 15–41)
Albumin: 3.6 g/dL (ref 3.5–5.0)
Alkaline Phosphatase: 84 U/L (ref 38–126)
BUN: 18 mg/dL (ref 6–20)
CO2: 26 mmol/L (ref 22–32)
Calcium: 9.4 mg/dL (ref 8.9–10.3)
Chloride: 108 mmol/L (ref 98–111)
Creatinine, Ser: 0.99 mg/dL (ref 0.44–1.00)
GFR calc Af Amer: >60 mL/min (ref >60)
GFR calc non Af Amer: >60 mL/min (ref >60)
Glucose, Bld: 152 mg/dL — ABNORMAL HIGH (ref 70–99)
POTASSIUM: 4.6 mmol/L (ref 3.5–5.1)
Sodium: 139 mmol/L (ref 135–145)
Total Bilirubin: 0.2 mg/dL — ABNORMAL LOW (ref 0.3–1.2)
Total Protein: 7.2 g/dL (ref 6.5–8.1)

## 2018-07-02 LAB — CBC WITH DIFFERENTIAL/PLATELET
BASOS ABS: 0 10*3/uL (ref 0.0–0.1)
BASOS PCT: 0 %
Eosinophils Absolute: 0.1 10*3/uL (ref 0.0–0.7)
Eosinophils Relative: 2 %
HEMATOCRIT: 34.8 % — AB (ref 36.0–46.0)
Hemoglobin: 11 g/dL — ABNORMAL LOW (ref 12.0–15.0)
LYMPHS PCT: 34 %
Lymphs Abs: 2.8 10*3/uL (ref 0.7–4.0)
MCH: 28.2 pg (ref 26.0–34.0)
MCHC: 31.6 g/dL (ref 30.0–36.0)
MCV: 89.2 fL (ref 78.0–100.0)
MONO ABS: 0.6 10*3/uL (ref 0.1–1.0)
Monocytes Relative: 8 %
NEUTROS ABS: 4.6 10*3/uL (ref 1.7–7.7)
Neutrophils Relative %: 56 %
Platelets: 361 10*3/uL (ref 150–400)
RBC: 3.9 MIL/uL (ref 3.87–5.11)
RDW: 15 % (ref 11.5–15.5)
WBC: 8.2 10*3/uL (ref 4.0–10.5)

## 2018-07-02 MED ORDER — CLINDAMYCIN HCL 150 MG PO CAPS: 300.0000 mg | ORAL_CAPSULE | Freq: Three times a day (TID) | ORAL | 0 refills | Status: DC

## 2018-07-02 MED ORDER — AMOXICILLIN-POT CLAVULANATE 875-125 MG PO TABS
1.0000 | ORAL_TABLET | Freq: Once | ORAL | Status: AC
  Administered 2018-07-02: 1 via ORAL
  Filled 2018-07-02: qty 1

## 2018-07-02 MED ORDER — MELOXICAM 7.5 MG PO TABS: 15.0000 mg | ORAL_TABLET | Freq: Every day | ORAL | 0 refills | Status: DC

## 2018-07-02 MED ORDER — KETOROLAC TROMETHAMINE 60 MG/2ML IM SOLN
60.0000 mg | Freq: Once | INTRAMUSCULAR | Status: AC
  Administered 2018-07-02: 60 mg via INTRAMUSCULAR
  Filled 2018-07-02: qty 2

## 2018-07-02 NOTE — Discharge Instructions (Signed)
Please see Dr. Janee Mornhompson or Dr. Luiz BlareGraves, if Dr. Janee Mornhompson is not available on Monday for a recheck of your hand.  Your x-rays are normal of both your knee and your hand.  Your blood work and EKG were also reassuring and normal.  Please seek medical exam for severe or worsening swelling pain or redness.  If you develop any fevers you should also be seen.  Otherwise take clindamycin 300 mg 3 times a day for the next 10 days to treat this postoperative infection.
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
## 2018-07-02 NOTE — ED Triage Notes (Signed)
Pt states she had surgery for a trigger finger on the  12th. Pt states that she is concerned for infection in her left palm. Area is reddened and painful.

## 2018-07-12 ENCOUNTER — Other Ambulatory Visit: Payer: Self-pay

## 2018-07-12 ENCOUNTER — Encounter (HOSPITAL_COMMUNITY): Payer: Self-pay | Admitting: Emergency Medicine

## 2018-07-12 ENCOUNTER — Emergency Department (HOSPITAL_COMMUNITY)
Admission: EM | Admit: 2018-07-12 | Discharge: 2018-07-12 | Disposition: A | Discharge status: Home / Self Care | Payer: Medicare Other | Attending: Emergency Medicine | Admitting: Emergency Medicine

## 2018-07-12 ENCOUNTER — Emergency Department (HOSPITAL_COMMUNITY): Payer: Medicare Other

## 2018-07-12 DIAGNOSIS — Y999 Unspecified external cause status: Secondary | ICD-10-CM | POA: Diagnosis not present

## 2018-07-12 DIAGNOSIS — W540XXA Bitten by dog, initial encounter: Secondary | ICD-10-CM | POA: Diagnosis not present

## 2018-07-12 DIAGNOSIS — J449 Chronic obstructive pulmonary disease, unspecified: Secondary | ICD-10-CM | POA: Diagnosis not present

## 2018-07-12 DIAGNOSIS — I10 Essential (primary) hypertension: Secondary | ICD-10-CM | POA: Insufficient documentation

## 2018-07-12 DIAGNOSIS — S61552A Open bite of left wrist, initial encounter: Secondary | ICD-10-CM | POA: Diagnosis not present

## 2018-07-12 DIAGNOSIS — S61452A Open bite of left hand, initial encounter: Secondary | ICD-10-CM | POA: Diagnosis present

## 2018-07-12 DIAGNOSIS — Z7984 Long term (current) use of oral hypoglycemic drugs: Secondary | ICD-10-CM | POA: Diagnosis not present

## 2018-07-12 DIAGNOSIS — Y9389 Activity, other specified: Secondary | ICD-10-CM | POA: Diagnosis not present

## 2018-07-12 DIAGNOSIS — E119 Type 2 diabetes mellitus without complications: Secondary | ICD-10-CM | POA: Diagnosis not present

## 2018-07-12 DIAGNOSIS — Z79899 Other long term (current) drug therapy: Secondary | ICD-10-CM | POA: Diagnosis not present

## 2018-07-12 DIAGNOSIS — Y929 Unspecified place or not applicable: Secondary | ICD-10-CM | POA: Insufficient documentation

## 2018-07-12 MED ORDER — FENTANYL CITRATE (PF) 100 MCG/2ML IJ SOLN
50.0000 ug | Freq: Once | INTRAMUSCULAR | Status: DC
  Filled 2018-07-12: qty 2

## 2018-07-12 MED ORDER — DOXYCYCLINE HYCLATE 100 MG PO CAPS: 100.0000 mg | ORAL_CAPSULE | Freq: Two times a day (BID) | ORAL | 0 refills | Status: AC

## 2018-07-12 MED ORDER — HYDROMORPHONE HCL 1 MG/ML IJ SOLN
1.0000 mg | Freq: Once | INTRAMUSCULAR | Status: AC
  Administered 2018-07-12: 1 mg via INTRAVENOUS
  Filled 2018-07-12: qty 1

## 2018-07-12 MED ORDER — LIDOCAINE HCL 2 % IJ SOLN
20.0000 mL | Freq: Once | INTRAMUSCULAR | Status: AC
  Administered 2018-07-12: 400 mg via INTRADERMAL
  Filled 2018-07-12: qty 20

## 2018-07-12 MED ORDER — DOXYCYCLINE HYCLATE 100 MG PO CAPS: 100.0000 mg | ORAL_CAPSULE | Freq: Two times a day (BID) | ORAL | 0 refills | Status: DC

## 2018-07-12 MED ORDER — FENTANYL CITRATE (PF) 100 MCG/2ML IJ SOLN
50.0000 ug | Freq: Once | INTRAMUSCULAR | Status: AC
  Administered 2018-07-12: 50 ug via INTRAVENOUS
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
## 2018-07-12 NOTE — ED Notes (Signed)
Patient verbalizes understanding of discharge instructions. Opportunity for questioning and answers were provided. Pt discharged from ED. 
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
## 2018-07-12 NOTE — ED Notes (Signed)
Patient transported to X-ray 
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
## 2018-08-03 ENCOUNTER — Emergency Department (HOSPITAL_COMMUNITY): Payer: Medicare Other

## 2018-08-03 ENCOUNTER — Emergency Department (HOSPITAL_COMMUNITY)
Admission: EM | Admit: 2018-08-03 | Discharge: 2018-08-03 | Disposition: A | Discharge status: Home / Self Care | Payer: Medicare Other | Attending: Emergency Medicine | Admitting: Emergency Medicine

## 2018-08-03 ENCOUNTER — Encounter (HOSPITAL_COMMUNITY): Payer: Self-pay | Admitting: Emergency Medicine

## 2018-08-03 ENCOUNTER — Other Ambulatory Visit: Payer: Self-pay

## 2018-08-03 DIAGNOSIS — J449 Chronic obstructive pulmonary disease, unspecified: Secondary | ICD-10-CM | POA: Insufficient documentation

## 2018-08-03 DIAGNOSIS — S0990XA Unspecified injury of head, initial encounter: Secondary | ICD-10-CM | POA: Diagnosis not present

## 2018-08-03 DIAGNOSIS — F17210 Nicotine dependence, cigarettes, uncomplicated: Secondary | ICD-10-CM | POA: Insufficient documentation

## 2018-08-03 DIAGNOSIS — M25511 Pain in right shoulder: Secondary | ICD-10-CM | POA: Diagnosis not present

## 2018-08-03 DIAGNOSIS — Y9389 Activity, other specified: Secondary | ICD-10-CM | POA: Insufficient documentation

## 2018-08-03 DIAGNOSIS — T148XXA Other injury of unspecified body region, initial encounter: Secondary | ICD-10-CM | POA: Diagnosis not present

## 2018-08-03 DIAGNOSIS — Y998 Other external cause status: Secondary | ICD-10-CM | POA: Insufficient documentation

## 2018-08-03 DIAGNOSIS — Z79899 Other long term (current) drug therapy: Secondary | ICD-10-CM | POA: Insufficient documentation

## 2018-08-03 DIAGNOSIS — Z7984 Long term (current) use of oral hypoglycemic drugs: Secondary | ICD-10-CM | POA: Diagnosis not present

## 2018-08-03 DIAGNOSIS — S62326A Displaced fracture of shaft of fifth metacarpal bone, right hand, initial encounter for closed fracture: Secondary | ICD-10-CM | POA: Insufficient documentation

## 2018-08-03 DIAGNOSIS — Y92481 Parking lot as the place of occurrence of the external cause: Secondary | ICD-10-CM | POA: Diagnosis not present

## 2018-08-03 DIAGNOSIS — E119 Type 2 diabetes mellitus without complications: Secondary | ICD-10-CM | POA: Insufficient documentation

## 2018-08-03 DIAGNOSIS — I10 Essential (primary) hypertension: Secondary | ICD-10-CM | POA: Insufficient documentation

## 2018-08-03 DIAGNOSIS — S6991XA Unspecified injury of right wrist, hand and finger(s), initial encounter: Secondary | ICD-10-CM | POA: Diagnosis present

## 2018-08-03 MED ORDER — OXYCODONE-ACETAMINOPHEN 5-325 MG PO TABS
1.0000 | ORAL_TABLET | Freq: Once | ORAL | Status: AC
  Administered 2018-08-03: 1 via ORAL
  Filled 2018-08-03: qty 1

## 2018-08-03 MED ORDER — OXYCODONE-ACETAMINOPHEN 5-325 MG PO TABS: 1.0000 | ORAL_TABLET | Freq: Three times a day (TID) | ORAL | 0 refills | Status: DC | PRN
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
## 2018-08-03 NOTE — ED Notes (Signed)
ORTHO AT BEDSIDE.
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
## 2018-08-03 NOTE — Discharge Instructions (Signed)
Return to ED for worsening symptoms, severe chest pain or abdominal pain, fever, vomiting up blood or numbness in your hands.

## 2019-08-19 ENCOUNTER — Other Ambulatory Visit: Payer: Self-pay

## 2019-08-19 ENCOUNTER — Emergency Department (HOSPITAL_COMMUNITY)
Admission: EM | Admit: 2019-08-19 | Discharge: 2019-08-19 | Disposition: A | Discharge status: Home / Self Care | Payer: Medicare Other | Attending: Emergency Medicine | Admitting: Emergency Medicine

## 2019-08-19 ENCOUNTER — Emergency Department (HOSPITAL_COMMUNITY): Payer: Medicare Other

## 2019-08-19 ENCOUNTER — Encounter (HOSPITAL_COMMUNITY): Payer: Self-pay | Admitting: *Deleted

## 2019-08-19 DIAGNOSIS — W109XXA Fall (on) (from) unspecified stairs and steps, initial encounter: Secondary | ICD-10-CM | POA: Insufficient documentation

## 2019-08-19 DIAGNOSIS — S82831A Other fracture of upper and lower end of right fibula, initial encounter for closed fracture: Secondary | ICD-10-CM | POA: Insufficient documentation

## 2019-08-19 DIAGNOSIS — I10 Essential (primary) hypertension: Secondary | ICD-10-CM | POA: Diagnosis not present

## 2019-08-19 DIAGNOSIS — Y999 Unspecified external cause status: Secondary | ICD-10-CM | POA: Insufficient documentation

## 2019-08-19 DIAGNOSIS — Z79899 Other long term (current) drug therapy: Secondary | ICD-10-CM | POA: Diagnosis not present

## 2019-08-19 DIAGNOSIS — F17210 Nicotine dependence, cigarettes, uncomplicated: Secondary | ICD-10-CM | POA: Diagnosis not present

## 2019-08-19 DIAGNOSIS — Y939 Activity, unspecified: Secondary | ICD-10-CM | POA: Diagnosis not present

## 2019-08-19 DIAGNOSIS — J449 Chronic obstructive pulmonary disease, unspecified: Secondary | ICD-10-CM | POA: Insufficient documentation

## 2019-08-19 DIAGNOSIS — Y929 Unspecified place or not applicable: Secondary | ICD-10-CM | POA: Diagnosis not present

## 2019-08-19 DIAGNOSIS — Z7984 Long term (current) use of oral hypoglycemic drugs: Secondary | ICD-10-CM | POA: Insufficient documentation

## 2019-08-19 DIAGNOSIS — S99911A Unspecified injury of right ankle, initial encounter: Secondary | ICD-10-CM | POA: Diagnosis present

## 2019-08-19 DIAGNOSIS — E119 Type 2 diabetes mellitus without complications: Secondary | ICD-10-CM | POA: Diagnosis not present

## 2019-08-19 NOTE — Discharge Instructions (Signed)
Wear boot. Take Motrin and Tylenol as needed as directed for pain. Elevate and apply ice to help with pain and swelling. Follow up with orthopedics.
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
## 2019-08-19 NOTE — ED Triage Notes (Signed)
Pt complains of right ankle pain since falling down stairs last week. Pt has been using an ace bandage, which has provided some relief. Pt is unable to bear weight on her right ankle without compression.

## 2019-08-28 IMAGING — CR DG CERVICAL SPINE COMPLETE 4+V
7 series · 7 of 7 positions shown · non-contrast
Comparison: Radiographs 08/25/2013

CLINICAL DATA: Cervical neck pain for 3 days.  No known injury.

EXAM:
CERVICAL SPINE - COMPLETE 4+ VIEW

[w cervical spine lat]
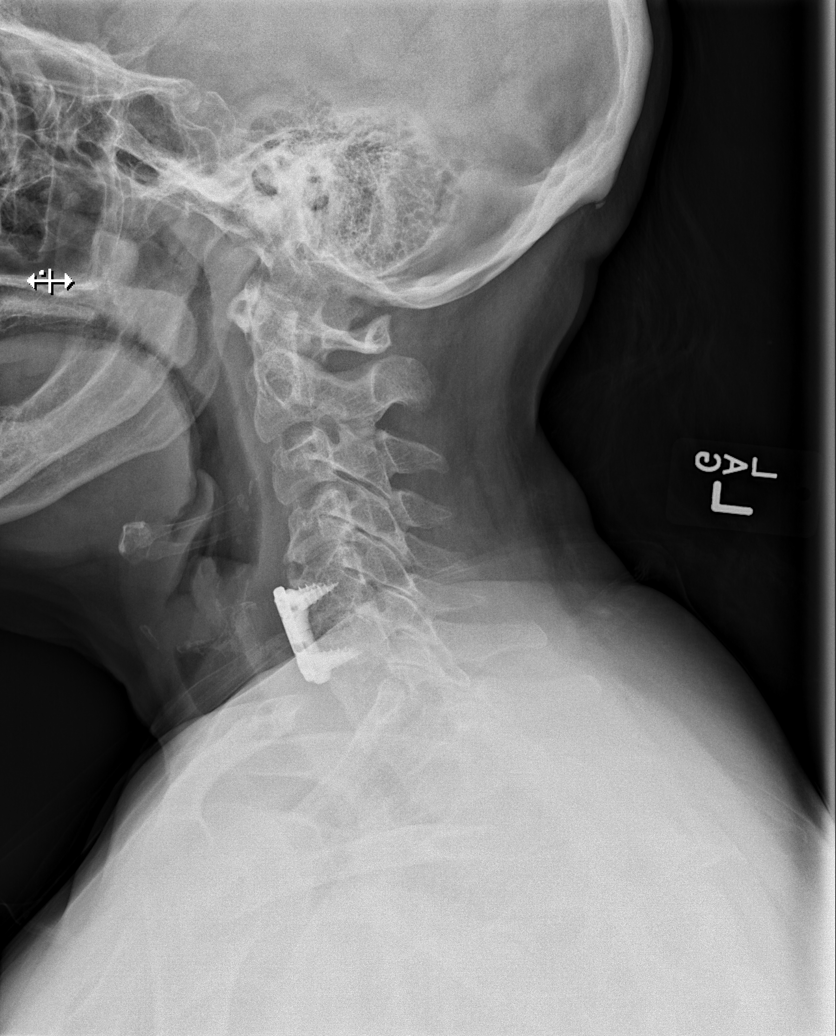

[w cervical spine ap_obl (1 of 2)]
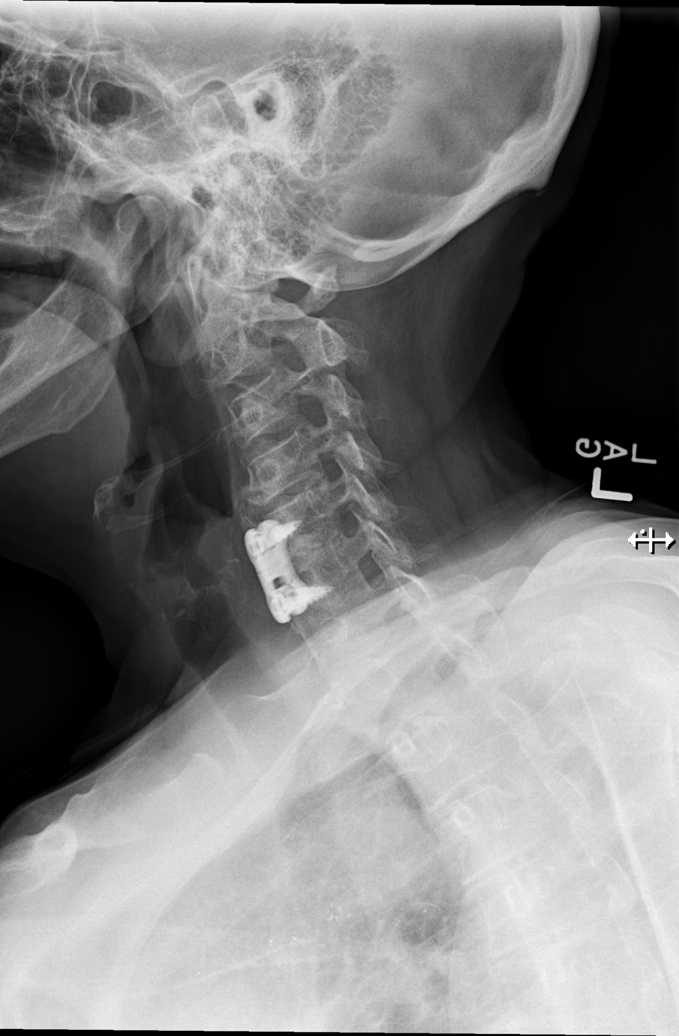

[w cervical spine ap_obl (2 of 2)]
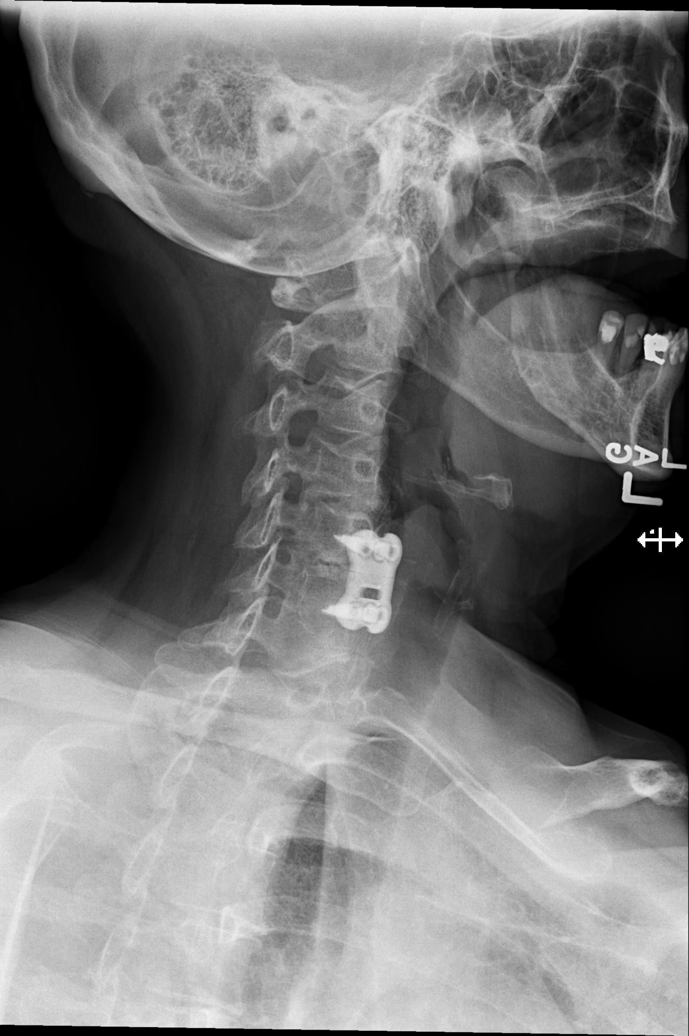

[w cervical spine ap]
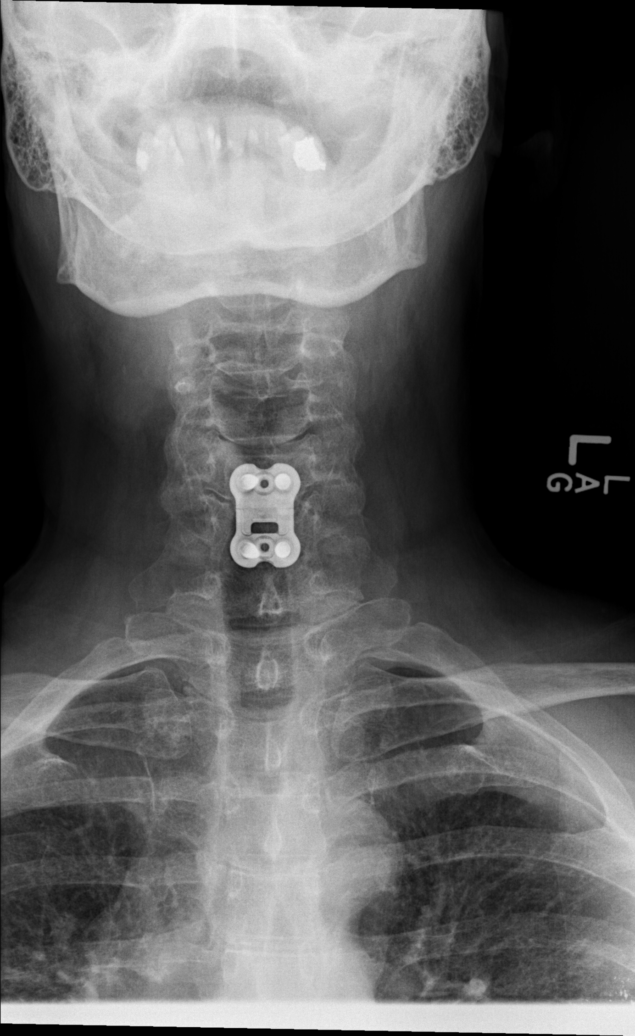

[w cervical spine odontoid (1 of 2)]
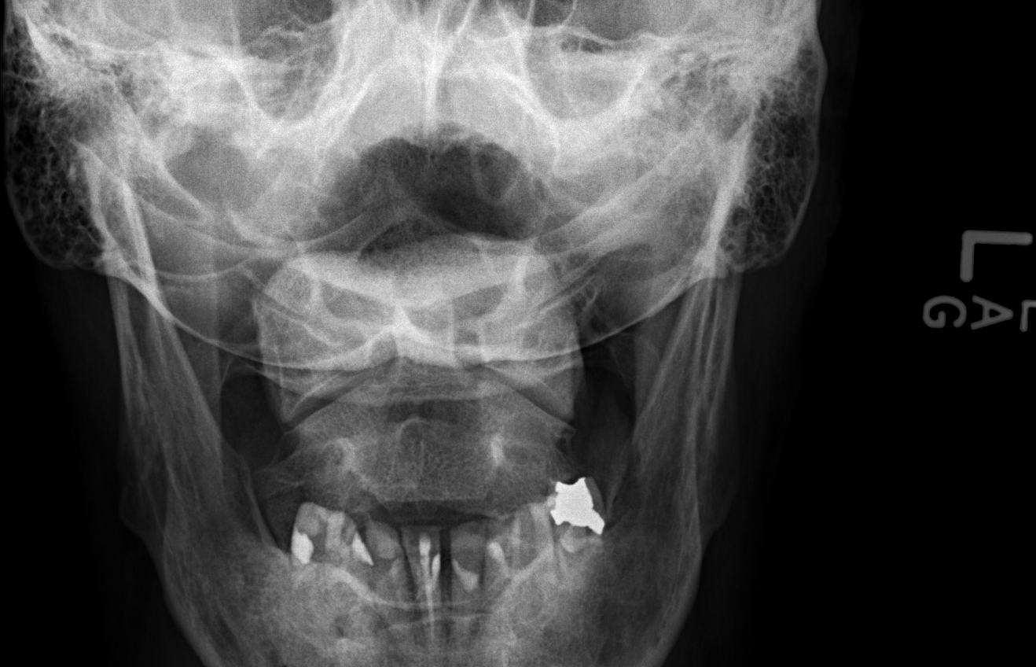

[w cervical spine odontoid (2 of 2)]
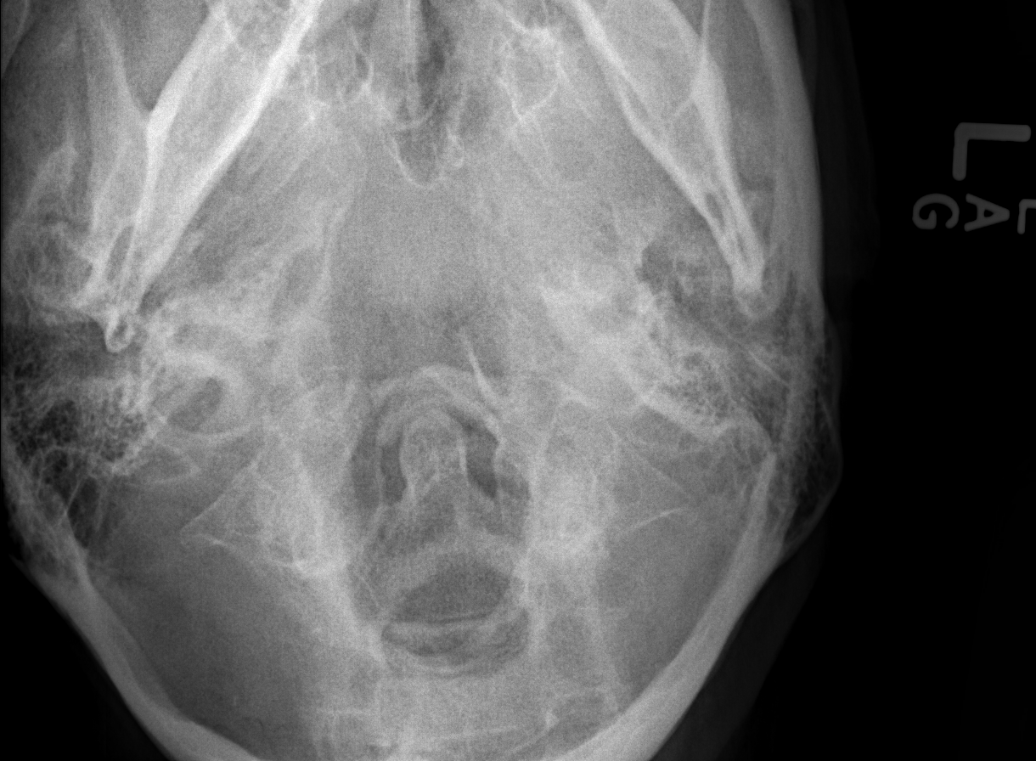

[w cervical swimmers]
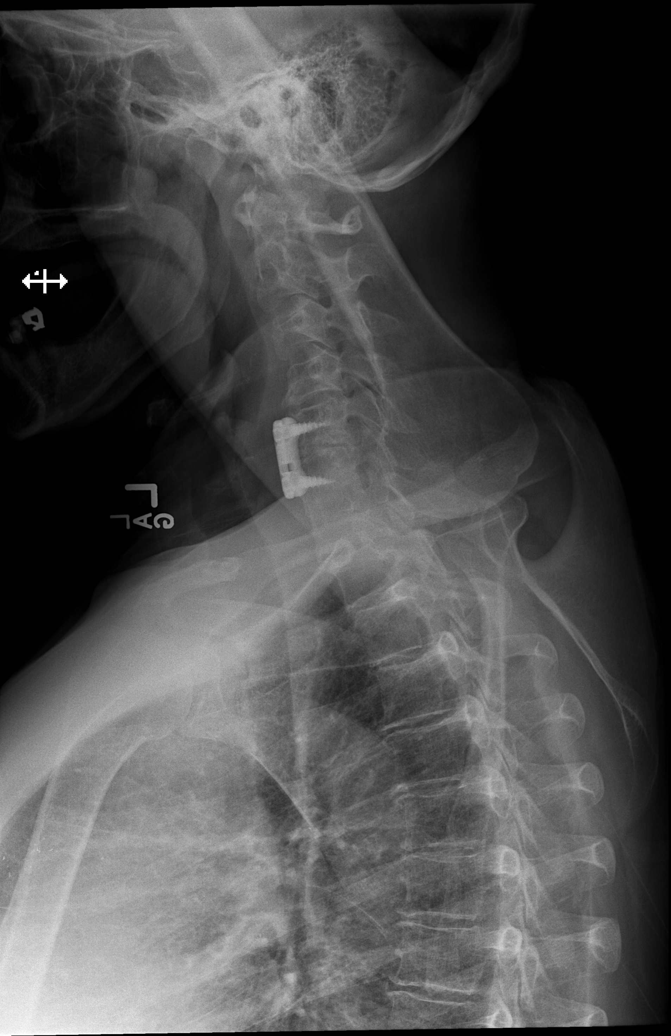

[7 of 7 positions shown; findings below may reference images not displayed]

FINDINGS: Anterior C5-C6 fusion with intact hardware. Alignment is maintained.
Progressive adjacent level degenerative disc disease at C4-C5 with
disc space narrowing and endplate spurring. Mild bony neural
foraminal narrowing at C5-C6, right greater than left. No evidence
of acute fracture. Lateral masses of C1 are well aligned on C2. No
prevertebral soft tissue swelling.
IMPRESSION: Anterior C5-C6 fusion with mild progression of adjacent level
degenerative disc disease at C4-C5 since 6132. Bony neural foraminal
stenosis at C5-C6, right greater than left.

## 2020-05-30 ENCOUNTER — Encounter (HOSPITAL_COMMUNITY): Payer: Self-pay

## 2020-05-30 ENCOUNTER — Emergency Department (HOSPITAL_COMMUNITY)
Admission: EM | Admit: 2020-05-30 | Discharge: 2020-05-30 | Disposition: A | Discharge status: Home / Self Care | Payer: Medicare Other | Attending: Emergency Medicine | Admitting: Emergency Medicine

## 2020-05-30 DIAGNOSIS — I10 Essential (primary) hypertension: Secondary | ICD-10-CM | POA: Insufficient documentation

## 2020-05-30 DIAGNOSIS — J449 Chronic obstructive pulmonary disease, unspecified: Secondary | ICD-10-CM | POA: Insufficient documentation

## 2020-05-30 DIAGNOSIS — E119 Type 2 diabetes mellitus without complications: Secondary | ICD-10-CM | POA: Diagnosis not present

## 2020-05-30 DIAGNOSIS — Y92007 Garden or yard of unspecified non-institutional (private) residence as the place of occurrence of the external cause: Secondary | ICD-10-CM | POA: Diagnosis not present

## 2020-05-30 DIAGNOSIS — W57XXXA Bitten or stung by nonvenomous insect and other nonvenomous arthropods, initial encounter: Secondary | ICD-10-CM | POA: Diagnosis not present

## 2020-05-30 DIAGNOSIS — F17210 Nicotine dependence, cigarettes, uncomplicated: Secondary | ICD-10-CM | POA: Diagnosis not present

## 2020-05-30 DIAGNOSIS — Y999 Unspecified external cause status: Secondary | ICD-10-CM | POA: Insufficient documentation

## 2020-05-30 DIAGNOSIS — Y9389 Activity, other specified: Secondary | ICD-10-CM | POA: Diagnosis not present

## 2020-05-30 DIAGNOSIS — Z794 Long term (current) use of insulin: Secondary | ICD-10-CM | POA: Insufficient documentation

## 2020-05-30 DIAGNOSIS — S30860A Insect bite (nonvenomous) of lower back and pelvis, initial encounter: Secondary | ICD-10-CM | POA: Diagnosis present

## 2020-05-30 DIAGNOSIS — Z79899 Other long term (current) drug therapy: Secondary | ICD-10-CM | POA: Insufficient documentation

## 2020-05-30 MED ORDER — TRIAMCINOLONE ACETONIDE 0.1 % EX CREA
TOPICAL_CREAM | Freq: Two times a day (BID) | CUTANEOUS | Status: DC
  Filled 2020-05-30: qty 15

## 2020-05-30 MED ORDER — DIPHENHYDRAMINE HCL 50 MG/ML IJ SOLN
25.0000 mg | Freq: Once | INTRAMUSCULAR | Status: AC
  Administered 2020-05-30: 25 mg via INTRAMUSCULAR
  Filled 2020-05-30: qty 1

## 2020-05-30 NOTE — Discharge Instructions (Signed)
I would switch from your hydroxyzine to Benadryl for now as this will likely help control your itching a little better given the bug bites.  Can continue using the topical Kenalog ointment. May wish to try Caladryl or similar to help dry out the bites. Follow-up with your primary care doctor. Return here for any new or acute changes.
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
## 2020-05-30 NOTE — ED Triage Notes (Signed)
Arrived POV from home. Patient reports she was raking in her yard and now she has all these bites all over her body.

## 2020-10-09 ENCOUNTER — Other Ambulatory Visit: Payer: Self-pay | Admitting: Nurse Practitioner

## 2020-10-09 DIAGNOSIS — Z1231 Encounter for screening mammogram for malignant neoplasm of breast: Secondary | ICD-10-CM

## 2020-10-21 ENCOUNTER — Other Ambulatory Visit: Payer: Self-pay | Admitting: Nurse Practitioner

## 2020-10-21 DIAGNOSIS — M810 Age-related osteoporosis without current pathological fracture: Secondary | ICD-10-CM

## 2021-04-29 DIAGNOSIS — F411 Generalized anxiety disorder: Secondary | ICD-10-CM | POA: Insufficient documentation

## 2021-04-29 DIAGNOSIS — F900 Attention-deficit hyperactivity disorder, predominantly inattentive type: Secondary | ICD-10-CM | POA: Diagnosis present

## 2021-07-01 ENCOUNTER — Encounter (HOSPITAL_COMMUNITY): Payer: Self-pay | Admitting: Student

## 2021-07-01 ENCOUNTER — Inpatient Hospital Stay (HOSPITAL_COMMUNITY)
Admission: EM | Admit: 2021-07-01 | Discharge: 2021-07-06 | DRG: 177 | Disposition: A | Discharge status: Home / Self Care | Payer: Medicare Other | Attending: Internal Medicine | Admitting: Internal Medicine

## 2021-07-01 ENCOUNTER — Emergency Department (HOSPITAL_COMMUNITY): Payer: Medicare Other

## 2021-07-01 ENCOUNTER — Other Ambulatory Visit: Payer: Self-pay

## 2021-07-01 DIAGNOSIS — Y92239 Unspecified place in hospital as the place of occurrence of the external cause: Secondary | ICD-10-CM | POA: Diagnosis not present

## 2021-07-01 DIAGNOSIS — R1031 Right lower quadrant pain: Secondary | ICD-10-CM | POA: Diagnosis present

## 2021-07-01 DIAGNOSIS — F17210 Nicotine dependence, cigarettes, uncomplicated: Secondary | ICD-10-CM | POA: Diagnosis present

## 2021-07-01 DIAGNOSIS — Z9181 History of falling: Secondary | ICD-10-CM

## 2021-07-01 DIAGNOSIS — F32A Depression, unspecified: Secondary | ICD-10-CM | POA: Diagnosis present

## 2021-07-01 DIAGNOSIS — T380X5A Adverse effect of glucocorticoids and synthetic analogues, initial encounter: Secondary | ICD-10-CM | POA: Diagnosis not present

## 2021-07-01 DIAGNOSIS — M545 Low back pain, unspecified: Secondary | ICD-10-CM | POA: Diagnosis present

## 2021-07-01 DIAGNOSIS — J449 Chronic obstructive pulmonary disease, unspecified: Secondary | ICD-10-CM | POA: Diagnosis not present

## 2021-07-01 DIAGNOSIS — F17200 Nicotine dependence, unspecified, uncomplicated: Secondary | ICD-10-CM | POA: Diagnosis not present

## 2021-07-01 DIAGNOSIS — Z7951 Long term (current) use of inhaled steroids: Secondary | ICD-10-CM

## 2021-07-01 DIAGNOSIS — G8929 Other chronic pain: Secondary | ICD-10-CM | POA: Diagnosis present

## 2021-07-01 DIAGNOSIS — E119 Type 2 diabetes mellitus without complications: Secondary | ICD-10-CM | POA: Diagnosis present

## 2021-07-01 DIAGNOSIS — Z981 Arthrodesis status: Secondary | ICD-10-CM | POA: Diagnosis not present

## 2021-07-01 DIAGNOSIS — Z90710 Acquired absence of both cervix and uterus: Secondary | ICD-10-CM

## 2021-07-01 DIAGNOSIS — U071 COVID-19: Secondary | ICD-10-CM | POA: Diagnosis present

## 2021-07-01 DIAGNOSIS — R0902 Hypoxemia: Secondary | ICD-10-CM | POA: Diagnosis present

## 2021-07-01 DIAGNOSIS — J441 Chronic obstructive pulmonary disease with (acute) exacerbation: Secondary | ICD-10-CM | POA: Diagnosis present

## 2021-07-01 DIAGNOSIS — F4310 Post-traumatic stress disorder, unspecified: Secondary | ICD-10-CM | POA: Diagnosis present

## 2021-07-01 DIAGNOSIS — Z79899 Other long term (current) drug therapy: Secondary | ICD-10-CM

## 2021-07-01 DIAGNOSIS — J9621 Acute and chronic respiratory failure with hypoxia: Secondary | ICD-10-CM

## 2021-07-01 DIAGNOSIS — E1165 Type 2 diabetes mellitus with hyperglycemia: Secondary | ICD-10-CM | POA: Diagnosis not present

## 2021-07-01 DIAGNOSIS — J9601 Acute respiratory failure with hypoxia: Secondary | ICD-10-CM | POA: Diagnosis present

## 2021-07-01 DIAGNOSIS — E785 Hyperlipidemia, unspecified: Secondary | ICD-10-CM | POA: Diagnosis present

## 2021-07-01 DIAGNOSIS — K219 Gastro-esophageal reflux disease without esophagitis: Secondary | ICD-10-CM | POA: Diagnosis present

## 2021-07-01 DIAGNOSIS — I10 Essential (primary) hypertension: Secondary | ICD-10-CM | POA: Diagnosis present

## 2021-07-01 DIAGNOSIS — Z7984 Long term (current) use of oral hypoglycemic drugs: Secondary | ICD-10-CM | POA: Diagnosis not present

## 2021-07-01 LAB — CBC WITH DIFFERENTIAL/PLATELET
Abs Immature Granulocytes: 0.04 10*3/uL (ref 0.00–0.07)
Basophils Absolute: 0 10*3/uL (ref 0.0–0.1)
Basophils Relative: 1 %
Eosinophils Absolute: 0 10*3/uL (ref 0.0–0.5)
Eosinophils Relative: 1 %
HCT: 36.7 % (ref 36.0–46.0)
Hemoglobin: 11.7 g/dL — ABNORMAL LOW (ref 12.0–15.0)
Immature Granulocytes: 1 %
Lymphocytes Relative: 6 %
Lymphs Abs: 0.5 10*3/uL — ABNORMAL LOW (ref 0.7–4.0)
MCH: 26.2 pg (ref 26.0–34.0)
MCHC: 31.9 g/dL (ref 30.0–36.0)
MCV: 82.3 fL (ref 80.0–100.0)
Monocytes Absolute: 0.7 10*3/uL (ref 0.1–1.0)
Monocytes Relative: 10 %
Neutro Abs: 5.9 10*3/uL (ref 1.7–7.7)
Neutrophils Relative %: 81 %
Platelets: 322 10*3/uL (ref 150–400)
RBC: 4.46 MIL/uL (ref 3.87–5.11)
RDW: 15.8 % — ABNORMAL HIGH (ref 11.5–15.5)
WBC: 7.2 10*3/uL (ref 4.0–10.5)
nRBC: 0 % (ref 0.0–0.2)

## 2021-07-01 LAB — RESP PANEL BY RT-PCR (FLU A&B, COVID) ARPGX2
Influenza A by PCR: NEGATIVE
Influenza B by PCR: NEGATIVE
SARS Coronavirus 2 by RT PCR: POSITIVE — AB

## 2021-07-01 LAB — LACTIC ACID, PLASMA: Lactic Acid, Venous: 1.8 mmol/L (ref 0.5–1.9)

## 2021-07-01 LAB — BASIC METABOLIC PANEL
Anion gap: 12 (ref 5–15)
BUN: 10 mg/dL (ref 6–20)
CO2: 21 mmol/L — ABNORMAL LOW (ref 22–32)
Calcium: 9.3 mg/dL (ref 8.9–10.3)
Chloride: 101 mmol/L (ref 98–111)
Creatinine, Ser: 1.12 mg/dL — ABNORMAL HIGH (ref 0.44–1.00)
GFR, Estimated: 58 mL/min — ABNORMAL LOW (ref >60)
Glucose, Bld: 130 mg/dL — ABNORMAL HIGH (ref 70–99)
Potassium: 3.6 mmol/L (ref 3.5–5.1)
Sodium: 134 mmol/L — ABNORMAL LOW (ref 135–145)

## 2021-07-01 MED ORDER — ONDANSETRON HCL 4 MG/2ML IJ SOLN
4.0000 mg | Freq: Four times a day (QID) | INTRAMUSCULAR | Status: DC | PRN
  Administered 2021-07-04: 4 mg via INTRAVENOUS
  Filled 2021-07-01: qty 2

## 2021-07-01 MED ORDER — INSULIN ASPART 100 UNIT/ML IJ SOLN
0.0000 [IU] | Freq: Three times a day (TID) | INTRAMUSCULAR | Status: DC
  Administered 2021-07-02: 7 [IU] via SUBCUTANEOUS
  Administered 2021-07-02: 4 [IU] via SUBCUTANEOUS
  Administered 2021-07-03: 20 [IU] via SUBCUTANEOUS
  Administered 2021-07-03: 4 [IU] via SUBCUTANEOUS
  Administered 2021-07-03: 3 [IU] via SUBCUTANEOUS
  Administered 2021-07-04: 4 [IU] via SUBCUTANEOUS
  Administered 2021-07-04: 7 [IU] via SUBCUTANEOUS
  Administered 2021-07-04 – 2021-07-05 (×2): 3 [IU] via SUBCUTANEOUS
  Administered 2021-07-05: 11 [IU] via SUBCUTANEOUS
  Administered 2021-07-05: 7 [IU] via SUBCUTANEOUS
  Administered 2021-07-06: 4 [IU] via SUBCUTANEOUS
  Filled 2021-07-01: qty 0.2

## 2021-07-01 MED ORDER — INSULIN ASPART 100 UNIT/ML IJ SOLN
0.0000 [IU] | Freq: Every day | INTRAMUSCULAR | Status: DC
  Administered 2021-07-02: 2 [IU] via SUBCUTANEOUS
  Filled 2021-07-01: qty 0.05

## 2021-07-01 MED ORDER — SODIUM CHLORIDE 0.9 % IV SOLN
100.0000 mg | Freq: Once | INTRAVENOUS | Status: AC
  Administered 2021-07-01: 100 mg via INTRAVENOUS
  Filled 2021-07-01: qty 20

## 2021-07-01 MED ORDER — DEXAMETHASONE SODIUM PHOSPHATE 10 MG/ML IJ SOLN: 6.0000 mg | INTRAMUSCULAR | Status: DC

## 2021-07-01 MED ORDER — LINAGLIPTIN 5 MG PO TABS
5.0000 mg | ORAL_TABLET | Freq: Every day | ORAL | Status: DC
  Administered 2021-07-02 – 2021-07-06 (×5): 5 mg via ORAL
  Filled 2021-07-01 (×5): qty 1

## 2021-07-01 MED ORDER — KETOROLAC TROMETHAMINE 30 MG/ML IJ SOLN
30.0000 mg | Freq: Once | INTRAMUSCULAR | Status: AC
  Administered 2021-07-01: 30 mg via INTRAVENOUS
  Filled 2021-07-01: qty 1

## 2021-07-01 MED ORDER — SODIUM CHLORIDE 0.9 % IV SOLN
100.0000 mg | Freq: Every day | INTRAVENOUS | Status: DC
  Filled 2021-07-01: qty 20

## 2021-07-01 MED ORDER — SODIUM CHLORIDE 0.9 % IV SOLN: 100.0000 mg | Freq: Every day | INTRAVENOUS | Status: DC

## 2021-07-01 MED ORDER — SODIUM CHLORIDE 0.9 % IV SOLN
100.0000 mg | Freq: Once | INTRAVENOUS | Status: AC
Start: 2021-07-01 — End: 2021-07-02
  Administered 2021-07-02: 100 mg via INTRAVENOUS
  Filled 2021-07-01: qty 20

## 2021-07-01 MED ORDER — HYDROMORPHONE HCL 1 MG/ML IJ SOLN
1.0000 mg | Freq: Once | INTRAMUSCULAR | Status: AC
Start: 2021-07-01 — End: 2021-07-01
  Administered 2021-07-01: 1 mg via INTRAVENOUS
  Filled 2021-07-01: qty 1

## 2021-07-01 MED ORDER — DEXAMETHASONE SODIUM PHOSPHATE 10 MG/ML IJ SOLN
6.0000 mg | Freq: Once | INTRAMUSCULAR | Status: AC
  Administered 2021-07-01: 6 mg via INTRAVENOUS
  Filled 2021-07-01: qty 1

## 2021-07-01 MED ORDER — ACETAMINOPHEN 325 MG PO TABS
650.0000 mg | ORAL_TABLET | Freq: Four times a day (QID) | ORAL | Status: DC | PRN
  Administered 2021-07-02 – 2021-07-03 (×4): 650 mg via ORAL
  Filled 2021-07-01 (×4): qty 2

## 2021-07-01 MED ORDER — ONDANSETRON HCL 4 MG PO TABS
4.0000 mg | ORAL_TABLET | Freq: Four times a day (QID) | ORAL | Status: DC | PRN
  Administered 2021-07-03: 4 mg via ORAL
  Filled 2021-07-01: qty 1

## 2021-07-01 MED ORDER — ONDANSETRON HCL 4 MG/2ML IJ SOLN
4.0000 mg | Freq: Once | INTRAMUSCULAR | Status: AC
  Administered 2021-07-01: 4 mg via INTRAVENOUS
  Filled 2021-07-01: qty 2

## 2021-07-01 MED ORDER — OXYCODONE HCL 5 MG PO TABS
5.0000 mg | ORAL_TABLET | ORAL | Status: DC | PRN
  Administered 2021-07-01 – 2021-07-02 (×3): 5 mg via ORAL
  Filled 2021-07-01 (×3): qty 1

## 2021-07-01 MED ORDER — SODIUM CHLORIDE 0.9 % IV SOLN: 200.0000 mg | Freq: Once | INTRAVENOUS | Status: DC

## 2021-07-01 MED ORDER — ENOXAPARIN SODIUM 40 MG/0.4ML IJ SOSY
40.0000 mg | PREFILLED_SYRINGE | INTRAMUSCULAR | Status: DC
  Administered 2021-07-01 – 2021-07-05 (×5): 40 mg via SUBCUTANEOUS
  Filled 2021-07-01 (×5): qty 0.4

## 2021-07-01 NOTE — ED Notes (Signed)
Pt de-satting into the 70's. Pt was moved up in the ED stretcher and sat upright in bed. 2L nasal cannula O2 placed. Pt with O2 sats at 91%. Kathlene November, RN., notified and aware.

## 2021-07-01 NOTE — ED Provider Notes (Signed)
Emergency Medicine Provider Triage Evaluation Note  Alison Kim , a 55 y.o. female  was evaluated in triage.  Pt complains of low back pain that radiates to bilateral hips and lower extremities that started earlier today.  Denies any trauma.  No IV drug use.  Patient admits to suprapubic pain.  Denies urinary symptoms.  No vaginal symptoms.  Patient took Tylenol prior to arrival with no relief in symptoms.  She admits to chronic low back pain which she attributes to a herniated disc.  Denies saddle paresthesias, bowel/bladder cons, lower extremity numbness/tingling, lower extremity weakness.  No sore throat, cough, or nasal congestion.  Patient is currently unvaccinated against COVID-19  Review of Systems  Positive: Low back pain Negative: Sore throat  Physical Exam  BP (!) 148/99 (BP Location: Right Arm)   Pulse (!) 109   Temp 99.5 F (37.5 C) (Oral)   Resp 20   Ht 5\' 7"  (1.702 m)   Wt 94.8 kg   SpO2 94%   BMI 32.73 kg/m  Gen:   Awake, no distress   Resp:  Normal effort  MSK:   Moves extremities without difficulty  Other:    Medical Decision Making  Medically screening exam initiated at 5:08 PM.  Appropriate orders placed.  Alison Kim was informed that the remainder of the evaluation will be completed by another provider, this initial triage assessment does not replace that evaluation, and the importance of remaining in the ED until their evaluation is complete.  Patient borderline febrile and tachycardic in triage.  Sepsis screening labs ordered.  Lumbar x-ray ordered.  COVID test.   Brandy Hale, PA-C 07/01/21 1710    07/03/21, MD 07/01/21 2229
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
## 2021-07-01 NOTE — ED Triage Notes (Signed)
Patient BIB [facility] c/o lower back pain 10/10 radiating to her bilateral hips and legs that came on suddenly.  Patient took 1000 mg PO tylenol prior to EMS arriving.  Patient denies falling, lifting anything heavy, and no trauma.  Patient does have DDD and has at back surgery in the patient.    170/98 115-HR 97% room air 120-CBG
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
## 2021-07-02 DIAGNOSIS — E119 Type 2 diabetes mellitus without complications: Secondary | ICD-10-CM

## 2021-07-02 DIAGNOSIS — J441 Chronic obstructive pulmonary disease with (acute) exacerbation: Secondary | ICD-10-CM

## 2021-07-02 DIAGNOSIS — U071 COVID-19: Secondary | ICD-10-CM | POA: Diagnosis not present

## 2021-07-02 DIAGNOSIS — I10 Essential (primary) hypertension: Secondary | ICD-10-CM | POA: Diagnosis not present

## 2021-07-02 DIAGNOSIS — J449 Chronic obstructive pulmonary disease, unspecified: Secondary | ICD-10-CM | POA: Diagnosis not present

## 2021-07-02 DIAGNOSIS — F17200 Nicotine dependence, unspecified, uncomplicated: Secondary | ICD-10-CM

## 2021-07-02 LAB — CBC WITH DIFFERENTIAL/PLATELET
Abs Immature Granulocytes: 0.03 10*3/uL (ref 0.00–0.07)
Basophils Absolute: 0 10*3/uL (ref 0.0–0.1)
Basophils Relative: 0 %
Eosinophils Absolute: 0 10*3/uL (ref 0.0–0.5)
Eosinophils Relative: 0 %
HCT: 33.8 % — ABNORMAL LOW (ref 36.0–46.0)
Hemoglobin: 10.4 g/dL — ABNORMAL LOW (ref 12.0–15.0)
Immature Granulocytes: 0 %
Lymphocytes Relative: 4 %
Lymphs Abs: 0.3 10*3/uL — ABNORMAL LOW (ref 0.7–4.0)
MCH: 25.9 pg — ABNORMAL LOW (ref 26.0–34.0)
MCHC: 30.8 g/dL (ref 30.0–36.0)
MCV: 84.1 fL (ref 80.0–100.0)
Monocytes Absolute: 0.3 10*3/uL (ref 0.1–1.0)
Monocytes Relative: 5 %
Neutro Abs: 6.3 10*3/uL (ref 1.7–7.7)
Neutrophils Relative %: 91 %
Platelets: 272 10*3/uL (ref 150–400)
RBC: 4.02 MIL/uL (ref 3.87–5.11)
RDW: 15.9 % — ABNORMAL HIGH (ref 11.5–15.5)
WBC: 7 10*3/uL (ref 4.0–10.5)
nRBC: 0 % (ref 0.0–0.2)

## 2021-07-02 LAB — URINALYSIS, ROUTINE W REFLEX MICROSCOPIC
Bacteria, UA: NONE SEEN
Bilirubin Urine: NEGATIVE
Glucose, UA: NEGATIVE mg/dL
Ketones, ur: 5 mg/dL — AB
Leukocytes,Ua: NEGATIVE
Nitrite: NEGATIVE
Protein, ur: 100 mg/dL — AB
Specific Gravity, Urine: 1.044 — ABNORMAL HIGH (ref 1.005–1.030)
pH: 5 (ref 5.0–8.0)

## 2021-07-02 LAB — COMPREHENSIVE METABOLIC PANEL
ALT: 41 U/L (ref 0–44)
AST: 40 U/L (ref 15–41)
Albumin: 3.7 g/dL (ref 3.5–5.0)
Alkaline Phosphatase: 96 U/L (ref 38–126)
Anion gap: 10 (ref 5–15)
BUN: 15 mg/dL (ref 6–20)
CO2: 22 mmol/L (ref 22–32)
Calcium: 8.7 mg/dL — ABNORMAL LOW (ref 8.9–10.3)
Chloride: 103 mmol/L (ref 98–111)
Creatinine, Ser: 1.23 mg/dL — ABNORMAL HIGH (ref 0.44–1.00)
GFR, Estimated: 52 mL/min — ABNORMAL LOW (ref >60)
Glucose, Bld: 239 mg/dL — ABNORMAL HIGH (ref 70–99)
Potassium: 4.3 mmol/L (ref 3.5–5.1)
Sodium: 135 mmol/L (ref 135–145)
Total Bilirubin: 0.6 mg/dL (ref 0.3–1.2)
Total Protein: 7 g/dL (ref 6.5–8.1)

## 2021-07-02 LAB — C-REACTIVE PROTEIN: CRP: 1.5 mg/dL — ABNORMAL HIGH (ref <1.0)

## 2021-07-02 LAB — CBG MONITORING, ED
Glucose-Capillary: 234 mg/dL — ABNORMAL HIGH (ref 70–99)
Glucose-Capillary: 151 mg/dL — ABNORMAL HIGH (ref 70–99)
Glucose-Capillary: 215 mg/dL — ABNORMAL HIGH (ref 70–99)

## 2021-07-02 LAB — GLUCOSE, CAPILLARY
Glucose-Capillary: 110 mg/dL — ABNORMAL HIGH (ref 70–99)
Glucose-Capillary: 172 mg/dL — ABNORMAL HIGH (ref 70–99)

## 2021-07-02 LAB — D-DIMER, QUANTITATIVE: D-Dimer, Quant: 0.72 ug/mL-FEU — ABNORMAL HIGH (ref 0.00–0.50)

## 2021-07-02 LAB — FERRITIN: Ferritin: 13 ng/mL (ref 11–307)

## 2021-07-02 MED ORDER — PANTOPRAZOLE SODIUM 40 MG PO TBEC: 40.0000 mg | DELAYED_RELEASE_TABLET | Freq: Every day | ORAL | Status: DC

## 2021-07-02 MED ORDER — AZITHROMYCIN 500 MG IV SOLR
500.0000 mg | INTRAVENOUS | Status: DC
  Administered 2021-07-02 – 2021-07-04 (×3): 500 mg via INTRAVENOUS
  Filled 2021-07-02 (×4): qty 500

## 2021-07-02 MED ORDER — FLUTICASONE PROPIONATE 50 MCG/ACT NA SUSP
2.0000 | Freq: Every day | NASAL | Status: DC
  Administered 2021-07-05 – 2021-07-06 (×2): 2 via NASAL
  Filled 2021-07-02: qty 16

## 2021-07-02 MED ORDER — FLUOXETINE HCL 20 MG PO CAPS
80.0000 mg | ORAL_CAPSULE | Freq: Every day | ORAL | Status: DC
  Administered 2021-07-02 – 2021-07-06 (×5): 80 mg via ORAL
  Filled 2021-07-02 (×5): qty 4

## 2021-07-02 MED ORDER — AMPHETAMINE-DEXTROAMPHETAMINE 20 MG PO TABS
30.0000 mg | ORAL_TABLET | Freq: Two times a day (BID) | ORAL | Status: DC
  Administered 2021-07-03 – 2021-07-06 (×7): 30 mg via ORAL
  Filled 2021-07-02 (×7): qty 1

## 2021-07-02 MED ORDER — NABUMETONE 500 MG PO TABS
1500.0000 mg | ORAL_TABLET | Freq: Every day | ORAL | Status: DC
  Administered 2021-07-02 – 2021-07-06 (×5): 1500 mg via ORAL
  Filled 2021-07-02 (×5): qty 3

## 2021-07-02 MED ORDER — PANTOPRAZOLE SODIUM 40 MG PO TBEC
40.0000 mg | DELAYED_RELEASE_TABLET | Freq: Every day | ORAL | Status: DC
  Administered 2021-07-02 – 2021-07-06 (×5): 40 mg via ORAL
  Filled 2021-07-02 (×5): qty 1

## 2021-07-02 MED ORDER — LISINOPRIL 20 MG PO TABS
20.0000 mg | ORAL_TABLET | Freq: Every day | ORAL | Status: DC
  Administered 2021-07-02 – 2021-07-06 (×5): 20 mg via ORAL
  Filled 2021-07-02 (×5): qty 1

## 2021-07-02 MED ORDER — ALPRAZOLAM 1 MG PO TABS
1.0000 mg | ORAL_TABLET | Freq: Three times a day (TID) | ORAL | Status: DC | PRN
  Administered 2021-07-03 – 2021-07-05 (×5): 1 mg via ORAL
  Filled 2021-07-02 (×5): qty 1

## 2021-07-02 MED ORDER — LORATADINE 10 MG PO TABS
10.0000 mg | ORAL_TABLET | Freq: Every day | ORAL | Status: DC
  Administered 2021-07-02 – 2021-07-06 (×5): 10 mg via ORAL
  Filled 2021-07-02 (×5): qty 1

## 2021-07-02 MED ORDER — OXYCODONE HCL 5 MG PO TABS
5.0000 mg | ORAL_TABLET | ORAL | Status: DC | PRN
  Administered 2021-07-02: 5 mg via ORAL
  Administered 2021-07-02: 10 mg via ORAL
  Administered 2021-07-03: 5 mg via ORAL
  Administered 2021-07-03: 10 mg via ORAL
  Filled 2021-07-02: qty 2
  Filled 2021-07-02: qty 1
  Filled 2021-07-02: qty 2
  Filled 2021-07-02: qty 1

## 2021-07-02 MED ORDER — ACETAMINOPHEN 325 MG PO TABS: 650.0000 mg | ORAL_TABLET | Freq: Four times a day (QID) | ORAL | Status: DC | PRN

## 2021-07-02 MED ORDER — IPRATROPIUM-ALBUTEROL 20-100 MCG/ACT IN AERS
1.0000 | INHALATION_SPRAY | Freq: Four times a day (QID) | RESPIRATORY_TRACT | Status: DC
  Administered 2021-07-02 – 2021-07-06 (×14): 1 via RESPIRATORY_TRACT
  Filled 2021-07-02: qty 4

## 2021-07-02 MED ORDER — METHYLPREDNISOLONE SODIUM SUCC 125 MG IJ SOLR
60.0000 mg | Freq: Two times a day (BID) | INTRAMUSCULAR | Status: DC
  Administered 2021-07-02 – 2021-07-03 (×3): 60 mg via INTRAVENOUS
  Filled 2021-07-02 (×3): qty 2

## 2021-07-02 MED ORDER — HYDROXYZINE HCL 50 MG PO TABS
75.0000 mg | ORAL_TABLET | Freq: Every day | ORAL | Status: DC
  Administered 2021-07-02 – 2021-07-06 (×5): 75 mg via ORAL
  Filled 2021-07-02 (×3): qty 1
  Filled 2021-07-02: qty 3
  Filled 2021-07-02: qty 1

## 2021-07-02 MED ORDER — KETOROLAC TROMETHAMINE 30 MG/ML IJ SOLN
30.0000 mg | Freq: Four times a day (QID) | INTRAMUSCULAR | Status: DC | PRN
  Administered 2021-07-02 (×2): 30 mg via INTRAVENOUS
  Filled 2021-07-02 (×2): qty 1

## 2021-07-02 MED ORDER — AMPHETAMINE-DEXTROAMPHETAMINE 10 MG PO TABS: 30.0000 mg | ORAL_TABLET | Freq: Two times a day (BID) | ORAL | Status: DC

## 2021-07-02 MED ORDER — MOMETASONE FURO-FORMOTEROL FUM 100-5 MCG/ACT IN AERO
2.0000 | INHALATION_SPRAY | Freq: Two times a day (BID) | RESPIRATORY_TRACT | Status: DC
  Administered 2021-07-02 – 2021-07-06 (×8): 2 via RESPIRATORY_TRACT
  Filled 2021-07-02: qty 8.8

## 2021-07-02 MED ORDER — NICOTINE 14 MG/24HR TD PT24
14.0000 mg | MEDICATED_PATCH | Freq: Every day | TRANSDERMAL | Status: DC
  Administered 2021-07-02 – 2021-07-06 (×5): 14 mg via TRANSDERMAL
  Filled 2021-07-02 (×6): qty 1

## 2021-07-02 MED ORDER — SODIUM CHLORIDE 0.9 % IV SOLN
100.0000 mg | Freq: Every day | INTRAVENOUS | Status: AC
  Administered 2021-07-03 – 2021-07-06 (×4): 100 mg via INTRAVENOUS
  Filled 2021-07-02 (×4): qty 20
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
## 2021-07-02 NOTE — ED Notes (Signed)
Pt yelling out in pain, states her back, knees and legs hurt. Also states the BP cuff is 'the worst pain off my life' and ripped off her cuff. Unable to update vitals. Pt refusing BP at this time.

## 2021-07-03 DIAGNOSIS — J449 Chronic obstructive pulmonary disease, unspecified: Secondary | ICD-10-CM | POA: Diagnosis not present

## 2021-07-03 DIAGNOSIS — I10 Essential (primary) hypertension: Secondary | ICD-10-CM | POA: Diagnosis not present

## 2021-07-03 DIAGNOSIS — F17200 Nicotine dependence, unspecified, uncomplicated: Secondary | ICD-10-CM | POA: Diagnosis not present

## 2021-07-03 DIAGNOSIS — U071 COVID-19: Secondary | ICD-10-CM | POA: Diagnosis not present

## 2021-07-03 LAB — CBC WITH DIFFERENTIAL/PLATELET
Abs Immature Granulocytes: 0.01 10*3/uL (ref 0.00–0.07)
Basophils Absolute: 0 10*3/uL (ref 0.0–0.1)
Basophils Relative: 0 %
Eosinophils Absolute: 0 10*3/uL (ref 0.0–0.5)
Eosinophils Relative: 0 %
HCT: 35.9 % — ABNORMAL LOW (ref 36.0–46.0)
Hemoglobin: 11.2 g/dL — ABNORMAL LOW (ref 12.0–15.0)
Immature Granulocytes: 0 %
Lymphocytes Relative: 10 %
Lymphs Abs: 0.5 10*3/uL — ABNORMAL LOW (ref 0.7–4.0)
MCH: 26.2 pg (ref 26.0–34.0)
MCHC: 31.2 g/dL (ref 30.0–36.0)
MCV: 84.1 fL (ref 80.0–100.0)
Monocytes Absolute: 0.2 10*3/uL (ref 0.1–1.0)
Monocytes Relative: 3 %
Neutro Abs: 4.1 10*3/uL (ref 1.7–7.7)
Neutrophils Relative %: 87 %
Platelets: 274 10*3/uL (ref 150–400)
RBC: 4.27 MIL/uL (ref 3.87–5.11)
RDW: 15.6 % — ABNORMAL HIGH (ref 11.5–15.5)
WBC: 4.7 10*3/uL (ref 4.0–10.5)
nRBC: 0 % (ref 0.0–0.2)

## 2021-07-03 LAB — COMPREHENSIVE METABOLIC PANEL
ALT: 50 U/L — ABNORMAL HIGH (ref 0–44)
AST: 40 U/L (ref 15–41)
Albumin: 3.6 g/dL (ref 3.5–5.0)
Alkaline Phosphatase: 92 U/L (ref 38–126)
Anion gap: 13 (ref 5–15)
BUN: 22 mg/dL — ABNORMAL HIGH (ref 6–20)
CO2: 20 mmol/L — ABNORMAL LOW (ref 22–32)
Calcium: 9.1 mg/dL (ref 8.9–10.3)
Chloride: 99 mmol/L (ref 98–111)
Creatinine, Ser: 1.14 mg/dL — ABNORMAL HIGH (ref 0.44–1.00)
GFR, Estimated: 57 mL/min — ABNORMAL LOW (ref >60)
Glucose, Bld: 402 mg/dL — ABNORMAL HIGH (ref 70–99)
Potassium: 4.6 mmol/L (ref 3.5–5.1)
Sodium: 132 mmol/L — ABNORMAL LOW (ref 135–145)
Total Bilirubin: 0.4 mg/dL (ref 0.3–1.2)
Total Protein: 7.3 g/dL (ref 6.5–8.1)

## 2021-07-03 LAB — D-DIMER, QUANTITATIVE: D-Dimer, Quant: 1.02 ug/mL-FEU — ABNORMAL HIGH (ref 0.00–0.50)

## 2021-07-03 LAB — GLUCOSE, CAPILLARY
Glucose-Capillary: 371 mg/dL — ABNORMAL HIGH (ref 70–99)
Glucose-Capillary: 125 mg/dL — ABNORMAL HIGH (ref 70–99)
Glucose-Capillary: 165 mg/dL — ABNORMAL HIGH (ref 70–99)
Glucose-Capillary: 197 mg/dL — ABNORMAL HIGH (ref 70–99)

## 2021-07-03 LAB — C-REACTIVE PROTEIN: CRP: 1.4 mg/dL — ABNORMAL HIGH (ref <1.0)

## 2021-07-03 LAB — FERRITIN: Ferritin: 31 ng/mL (ref 11–307)

## 2021-07-03 MED ORDER — BENZONATATE 100 MG PO CAPS
200.0000 mg | ORAL_CAPSULE | Freq: Three times a day (TID) | ORAL | Status: DC | PRN
  Administered 2021-07-05: 200 mg via ORAL
  Filled 2021-07-03: qty 2

## 2021-07-03 MED ORDER — METHYLPREDNISOLONE SODIUM SUCC 125 MG IJ SOLR
60.0000 mg | Freq: Every day | INTRAMUSCULAR | Status: DC
  Administered 2021-07-04: 60 mg via INTRAVENOUS
  Filled 2021-07-03: qty 2

## 2021-07-03 MED ORDER — INSULIN GLARGINE 100 UNIT/ML ~~LOC~~ SOLN
10.0000 [IU] | Freq: Every day | SUBCUTANEOUS | Status: DC
  Administered 2021-07-03 – 2021-07-06 (×4): 10 [IU] via SUBCUTANEOUS
  Filled 2021-07-03 (×4): qty 0.1
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
## 2021-07-03 NOTE — Plan of Care (Signed)
  Problem: Education: Goal: Knowledge of disease or condition will improve Outcome: Progressing Goal: Knowledge of the prescribed therapeutic regimen will improve Outcome: Progressing Goal: Individualized Educational Video(s) Outcome: Progressing   Problem: Activity: Goal: Ability to tolerate increased activity will improve Outcome: Progressing Goal: Will verbalize the importance of balancing activity with adequate rest periods Outcome: Progressing   Problem: Respiratory: Goal: Ability to maintain a clear airway will improve Outcome: Progressing Goal: Levels of oxygenation will improve Outcome: Progressing Goal: Ability to maintain adequate ventilation will improve Outcome: Progressing   Problem: Education: Goal: Knowledge of risk factors and measures for prevention of condition will improve Outcome: Progressing   Problem: Coping: Goal: Psychosocial and spiritual needs will be supported Outcome: Progressing   Problem: Respiratory: Goal: Will maintain a patent airway Outcome: Progressing Goal: Complications related to the disease process, condition or treatment will be avoided or minimized Outcome: Progressing

## 2021-07-04 DIAGNOSIS — J441 Chronic obstructive pulmonary disease with (acute) exacerbation: Secondary | ICD-10-CM | POA: Diagnosis not present

## 2021-07-04 DIAGNOSIS — I10 Essential (primary) hypertension: Secondary | ICD-10-CM | POA: Diagnosis not present

## 2021-07-04 DIAGNOSIS — U071 COVID-19: Secondary | ICD-10-CM | POA: Diagnosis not present

## 2021-07-04 DIAGNOSIS — F17200 Nicotine dependence, unspecified, uncomplicated: Secondary | ICD-10-CM | POA: Diagnosis not present

## 2021-07-04 LAB — D-DIMER, QUANTITATIVE: D-Dimer, Quant: 0.72 ug/mL-FEU — ABNORMAL HIGH (ref 0.00–0.50)

## 2021-07-04 LAB — CBC WITH DIFFERENTIAL/PLATELET
Abs Immature Granulocytes: 0.02 10*3/uL (ref 0.00–0.07)
Basophils Absolute: 0 10*3/uL (ref 0.0–0.1)
Basophils Relative: 0 %
Eosinophils Absolute: 0 10*3/uL (ref 0.0–0.5)
Eosinophils Relative: 0 %
HCT: 35.7 % — ABNORMAL LOW (ref 36.0–46.0)
Hemoglobin: 11.2 g/dL — ABNORMAL LOW (ref 12.0–15.0)
Immature Granulocytes: 0 %
Lymphocytes Relative: 27 %
Lymphs Abs: 1.7 10*3/uL (ref 0.7–4.0)
MCH: 26.4 pg (ref 26.0–34.0)
MCHC: 31.4 g/dL (ref 30.0–36.0)
MCV: 84 fL (ref 80.0–100.0)
Monocytes Absolute: 0.6 10*3/uL (ref 0.1–1.0)
Monocytes Relative: 10 %
Neutro Abs: 4 10*3/uL (ref 1.7–7.7)
Neutrophils Relative %: 63 %
Platelets: 255 10*3/uL (ref 150–400)
RBC: 4.25 MIL/uL (ref 3.87–5.11)
RDW: 15.9 % — ABNORMAL HIGH (ref 11.5–15.5)
WBC: 6.4 10*3/uL (ref 4.0–10.5)
nRBC: 0 % (ref 0.0–0.2)

## 2021-07-04 LAB — COMPREHENSIVE METABOLIC PANEL
ALT: 38 U/L (ref 0–44)
AST: 23 U/L (ref 15–41)
Albumin: 3.2 g/dL — ABNORMAL LOW (ref 3.5–5.0)
Alkaline Phosphatase: 88 U/L (ref 38–126)
Anion gap: 7 (ref 5–15)
BUN: 23 mg/dL — ABNORMAL HIGH (ref 6–20)
CO2: 25 mmol/L (ref 22–32)
Calcium: 8.8 mg/dL — ABNORMAL LOW (ref 8.9–10.3)
Chloride: 104 mmol/L (ref 98–111)
Creatinine, Ser: 1.02 mg/dL — ABNORMAL HIGH (ref 0.44–1.00)
GFR, Estimated: >60 mL/min (ref >60)
Glucose, Bld: 166 mg/dL — ABNORMAL HIGH (ref 70–99)
Potassium: 4 mmol/L (ref 3.5–5.1)
Sodium: 136 mmol/L (ref 135–145)
Total Bilirubin: 0.4 mg/dL (ref 0.3–1.2)
Total Protein: 6.6 g/dL (ref 6.5–8.1)

## 2021-07-04 LAB — C-REACTIVE PROTEIN: CRP: 0.8 mg/dL (ref <1.0)

## 2021-07-04 LAB — GLUCOSE, CAPILLARY
Glucose-Capillary: 151 mg/dL — ABNORMAL HIGH (ref 70–99)
Glucose-Capillary: 126 mg/dL — ABNORMAL HIGH (ref 70–99)
Glucose-Capillary: 221 mg/dL — ABNORMAL HIGH (ref 70–99)
Glucose-Capillary: 153 mg/dL — ABNORMAL HIGH (ref 70–99)

## 2021-07-04 LAB — FERRITIN: Ferritin: 32 ng/mL (ref 11–307)

## 2021-07-04 MED ORDER — SENNOSIDES-DOCUSATE SODIUM 8.6-50 MG PO TABS
1.0000 | ORAL_TABLET | Freq: Two times a day (BID) | ORAL | Status: DC
  Administered 2021-07-04: 1 via ORAL
  Filled 2021-07-04 (×3): qty 1

## 2021-07-04 MED ORDER — POLYETHYLENE GLYCOL 3350 17 G PO PACK
17.0000 g | PACK | Freq: Two times a day (BID) | ORAL | Status: DC
  Administered 2021-07-04: 17 g via ORAL
  Filled 2021-07-04 (×3): qty 1

## 2021-07-04 MED ORDER — SORBITOL 70 % SOLN
30.0000 mL | Status: AC
  Administered 2021-07-04: 30 mL via ORAL
  Filled 2021-07-04: qty 30

## 2021-07-04 MED ORDER — PREDNISONE 50 MG PO TABS
60.0000 mg | ORAL_TABLET | Freq: Every day | ORAL | Status: DC
  Administered 2021-07-05 – 2021-07-06 (×2): 60 mg via ORAL
  Filled 2021-07-04 (×2): qty 1

## 2021-07-04 NOTE — Progress Notes (Signed)
Chart error

## 2021-07-04 NOTE — Progress Notes (Signed)
CPT charted in error on wrong patient

## 2021-07-04 NOTE — Progress Notes (Signed)
SATURATION QUALIFICATIONS: (This note is used to comply with regulatory documentation for home oxygen)  Patient Saturations on Room Air at Rest = 98%  Patient Saturations on Room Air while Ambulating = 94%  Patient Saturations on 0 Liters of oxygen while Ambulating = 94%  Please briefly explain why patient needs home oxygen: No O2 needed
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
## 2021-07-05 DIAGNOSIS — I10 Essential (primary) hypertension: Secondary | ICD-10-CM | POA: Diagnosis not present

## 2021-07-05 DIAGNOSIS — U071 COVID-19: Secondary | ICD-10-CM | POA: Diagnosis not present

## 2021-07-05 DIAGNOSIS — F17200 Nicotine dependence, unspecified, uncomplicated: Secondary | ICD-10-CM | POA: Diagnosis not present

## 2021-07-05 DIAGNOSIS — J441 Chronic obstructive pulmonary disease with (acute) exacerbation: Secondary | ICD-10-CM | POA: Diagnosis not present

## 2021-07-05 LAB — CBC
HCT: 35.9 % — ABNORMAL LOW (ref 36.0–46.0)
Hemoglobin: 11.2 g/dL — ABNORMAL LOW (ref 12.0–15.0)
MCH: 26.2 pg (ref 26.0–34.0)
MCHC: 31.2 g/dL (ref 30.0–36.0)
MCV: 84.1 fL (ref 80.0–100.0)
Platelets: 270 10*3/uL (ref 150–400)
RBC: 4.27 MIL/uL (ref 3.87–5.11)
RDW: 15.9 % — ABNORMAL HIGH (ref 11.5–15.5)
WBC: 5.6 10*3/uL (ref 4.0–10.5)
nRBC: 0 % (ref 0.0–0.2)

## 2021-07-05 LAB — COMPREHENSIVE METABOLIC PANEL
ALT: 29 U/L (ref 0–44)
AST: 15 U/L (ref 15–41)
Albumin: 3.2 g/dL — ABNORMAL LOW (ref 3.5–5.0)
Alkaline Phosphatase: 84 U/L (ref 38–126)
Anion gap: 6 (ref 5–15)
BUN: 25 mg/dL — ABNORMAL HIGH (ref 6–20)
CO2: 25 mmol/L (ref 22–32)
Calcium: 9.1 mg/dL (ref 8.9–10.3)
Chloride: 106 mmol/L (ref 98–111)
Creatinine, Ser: 1.03 mg/dL — ABNORMAL HIGH (ref 0.44–1.00)
GFR, Estimated: >60 mL/min (ref >60)
Glucose, Bld: 137 mg/dL — ABNORMAL HIGH (ref 70–99)
Potassium: 4.3 mmol/L (ref 3.5–5.1)
Sodium: 137 mmol/L (ref 135–145)
Total Bilirubin: 0.4 mg/dL (ref 0.3–1.2)
Total Protein: 6.5 g/dL (ref 6.5–8.1)

## 2021-07-05 LAB — GLUCOSE, CAPILLARY
Glucose-Capillary: 140 mg/dL — ABNORMAL HIGH (ref 70–99)
Glucose-Capillary: 272 mg/dL — ABNORMAL HIGH (ref 70–99)
Glucose-Capillary: 249 mg/dL — ABNORMAL HIGH (ref 70–99)
Glucose-Capillary: 190 mg/dL — ABNORMAL HIGH (ref 70–99)

## 2021-07-05 LAB — D-DIMER, QUANTITATIVE: D-Dimer, Quant: 0.7 ug/mL-FEU — ABNORMAL HIGH (ref 0.00–0.50)

## 2021-07-05 LAB — FERRITIN: Ferritin: 25 ng/mL (ref 11–307)

## 2021-07-05 LAB — C-REACTIVE PROTEIN: CRP: 1 mg/dL — ABNORMAL HIGH (ref <1.0)

## 2021-07-05 MED ORDER — AZITHROMYCIN 250 MG PO TABS
500.0000 mg | ORAL_TABLET | Freq: Every day | ORAL | Status: DC
  Administered 2021-07-05 – 2021-07-06 (×2): 500 mg via ORAL
  Filled 2021-07-05 (×2): qty 2

## 2021-07-05 NOTE — Progress Notes (Signed)
Approached by pt and asked if she could go downstairs to smoke. Pt informed that it was not allowed because she is in the hospital and is on telemetry.  Pt is aware of ramifications should she step off the unit.  Nursing assistant has informed me that patient has refused vital signs and CBG monitoring.  Will attempt to ask pt again if she wants to have vitals and cbg taken.
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
## 2021-07-05 NOTE — Plan of Care (Signed)
  Problem: Education: Goal: Knowledge of disease or condition will improve 07/05/2021 2000 by Annitta Jersey, RN Outcome: Progressing 07/05/2021 1958 by Llana Aliment D, RN Outcome: Progressing Goal: Knowledge of the prescribed therapeutic regimen will improve 07/05/2021 2000 by Annitta Jersey, RN Outcome: Progressing 07/05/2021 1958 by Annitta Jersey, RN Outcome: Progressing Goal: Individualized Educational Video(s) 07/05/2021 2000 by Llana Aliment D, RN Outcome: Progressing 07/05/2021 1958 by Annitta Jersey, RN Outcome: Progressing   Problem: Activity: Goal: Ability to tolerate increased activity will improve 07/05/2021 2000 by Llana Aliment D, RN Outcome: Progressing 07/05/2021 1958 by Annitta Jersey, RN Outcome: Progressing Goal: Will verbalize the importance of balancing activity with adequate rest periods 07/05/2021 2000 by Annitta Jersey, RN Outcome: Progressing 07/05/2021 1958 by Llana Aliment D, RN Outcome: Progressing   Problem: Respiratory: Goal: Ability to maintain a clear airway will improve 07/05/2021 2000 by Annitta Jersey, RN Outcome: Progressing 07/05/2021 1958 by Llana Aliment D, RN Outcome: Progressing Goal: Levels of oxygenation will improve 07/05/2021 2000 by Annitta Jersey, RN Outcome: Progressing 07/05/2021 1958 by Llana Aliment D, RN Outcome: Progressing Goal: Ability to maintain adequate ventilation will improve 07/05/2021 2000 by Annitta Jersey, RN Outcome: Progressing 07/05/2021 1958 by Llana Aliment D, RN Outcome: Progressing   Problem: Education: Goal: Knowledge of risk factors and measures for prevention of condition will improve 07/05/2021 2000 by Annitta Jersey, RN Outcome: Progressing 07/05/2021 1958 by Llana Aliment D, RN Outcome: Progressing   Problem: Coping: Goal: Psychosocial and spiritual needs will be supported 07/05/2021 2000 by Annitta Jersey, RN Outcome: Progressing 07/05/2021 1958 by Llana Aliment D,  RN Outcome: Progressing   Problem: Respiratory: Goal: Will maintain a patent airway 07/05/2021 2000 by Llana Aliment D, RN Outcome: Progressing 07/05/2021 1958 by Annitta Jersey, RN Outcome: Progressing Goal: Complications related to the disease process, condition or treatment will be avoided or minimized 07/05/2021 2000 by Annitta Jersey, RN Outcome: Progressing 07/05/2021 1958 by Annitta Jersey, RN Outcome: Progressing

## 2021-07-05 NOTE — Plan of Care (Signed)
  Problem: Education: Goal: Knowledge of disease or condition will improve Outcome: Progressing Goal: Knowledge of the prescribed therapeutic regimen will improve Outcome: Progressing Goal: Individualized Educational Video(s) Outcome: Progressing   Problem: Activity: Goal: Ability to tolerate increased activity will improve Outcome: Progressing Goal: Will verbalize the importance of balancing activity with adequate rest periods Outcome: Progressing   Problem: Respiratory: Goal: Ability to maintain a clear airway will improve Outcome: Progressing Goal: Levels of oxygenation will improve Outcome: Progressing Goal: Ability to maintain adequate ventilation will improve Outcome: Progressing   Problem: Education: Goal: Knowledge of risk factors and measures for prevention of condition will improve Outcome: Progressing   Problem: Coping: Goal: Psychosocial and spiritual needs will be supported Outcome: Progressing   Problem: Respiratory: Goal: Will maintain a patent airway Outcome: Progressing Goal: Complications related to the disease process, condition or treatment will be avoided or minimized Outcome: Progressing   

## 2021-07-06 DIAGNOSIS — J441 Chronic obstructive pulmonary disease with (acute) exacerbation: Secondary | ICD-10-CM | POA: Diagnosis not present

## 2021-07-06 DIAGNOSIS — U071 COVID-19: Secondary | ICD-10-CM | POA: Diagnosis not present

## 2021-07-06 DIAGNOSIS — F17200 Nicotine dependence, unspecified, uncomplicated: Secondary | ICD-10-CM | POA: Diagnosis not present

## 2021-07-06 DIAGNOSIS — I10 Essential (primary) hypertension: Secondary | ICD-10-CM | POA: Diagnosis not present

## 2021-07-06 LAB — CULTURE, BLOOD (ROUTINE X 2)
Culture: NO GROWTH
Special Requests: ADEQUATE

## 2021-07-06 LAB — C-REACTIVE PROTEIN: CRP: 1.4 mg/dL — ABNORMAL HIGH (ref <1.0)

## 2021-07-06 LAB — COMPREHENSIVE METABOLIC PANEL
ALT: 25 U/L (ref 0–44)
AST: 13 U/L — ABNORMAL LOW (ref 15–41)
Albumin: 3.3 g/dL — ABNORMAL LOW (ref 3.5–5.0)
Alkaline Phosphatase: 82 U/L (ref 38–126)
Anion gap: 6 (ref 5–15)
BUN: 27 mg/dL — ABNORMAL HIGH (ref 6–20)
CO2: 24 mmol/L (ref 22–32)
Calcium: 9 mg/dL (ref 8.9–10.3)
Chloride: 103 mmol/L (ref 98–111)
Creatinine, Ser: 1.02 mg/dL — ABNORMAL HIGH (ref 0.44–1.00)
GFR, Estimated: >60 mL/min (ref >60)
Glucose, Bld: 238 mg/dL — ABNORMAL HIGH (ref 70–99)
Potassium: 4 mmol/L (ref 3.5–5.1)
Sodium: 133 mmol/L — ABNORMAL LOW (ref 135–145)
Total Bilirubin: 0.4 mg/dL (ref 0.3–1.2)
Total Protein: 6.6 g/dL (ref 6.5–8.1)

## 2021-07-06 LAB — D-DIMER, QUANTITATIVE: D-Dimer, Quant: 0.61 ug/mL-FEU — ABNORMAL HIGH (ref 0.00–0.50)

## 2021-07-06 LAB — GLUCOSE, CAPILLARY
Glucose-Capillary: 118 mg/dL — ABNORMAL HIGH (ref 70–99)
Glucose-Capillary: 174 mg/dL — ABNORMAL HIGH (ref 70–99)

## 2021-07-06 LAB — FERRITIN: Ferritin: 21 ng/mL (ref 11–307)

## 2021-07-06 MED ORDER — OXYCODONE HCL 5 MG PO TABS: 5.0000 mg | ORAL_TABLET | ORAL | 0 refills | Status: DC | PRN

## 2021-07-06 MED ORDER — BENZONATATE 200 MG PO CAPS: 200.0000 mg | ORAL_CAPSULE | Freq: Three times a day (TID) | ORAL | 0 refills | Status: DC | PRN

## 2021-07-06 MED ORDER — ALBUTEROL SULFATE HFA 108 (90 BASE) MCG/ACT IN AERS: 2.0000 | INHALATION_SPRAY | Freq: Four times a day (QID) | RESPIRATORY_TRACT | 0 refills | Status: DC | PRN

## 2021-07-06 MED ORDER — POLYETHYLENE GLYCOL 3350 17 G PO PACK: 17.0000 g | PACK | Freq: Every day | ORAL | 0 refills | Status: DC | PRN

## 2021-07-06 MED ORDER — NICOTINE 14 MG/24HR TD PT24: 14.0000 mg | MEDICATED_PATCH | Freq: Every day | TRANSDERMAL | 0 refills | Status: AC

## 2021-07-06 MED ORDER — ALBUTEROL SULFATE HFA 108 (90 BASE) MCG/ACT IN AERS: 2.0000 | INHALATION_SPRAY | Freq: Four times a day (QID) | RESPIRATORY_TRACT | 0 refills | Status: AC | PRN

## 2021-07-06 MED ORDER — PREDNISONE 20 MG PO TABS: ORAL_TABLET | ORAL | 0 refills | Status: AC

## 2021-07-06 NOTE — Progress Notes (Signed)
AVS given to patient and explained at the bedside. Medications and follow up appointments have been explained with pt verbalizing understanding.  
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
## 2021-07-22 DIAGNOSIS — J069 Acute upper respiratory infection, unspecified: Secondary | ICD-10-CM | POA: Insufficient documentation

## 2022-10-21 DIAGNOSIS — E538 Deficiency of other specified B group vitamins: Secondary | ICD-10-CM | POA: Insufficient documentation

## 2022-10-21 DIAGNOSIS — E559 Vitamin D deficiency, unspecified: Secondary | ICD-10-CM | POA: Insufficient documentation

## 2022-10-22 ENCOUNTER — Other Ambulatory Visit: Payer: Self-pay | Admitting: Nurse Practitioner

## 2022-10-22 DIAGNOSIS — Z1231 Encounter for screening mammogram for malignant neoplasm of breast: Secondary | ICD-10-CM

## 2022-12-29 ENCOUNTER — Ambulatory Visit
Admission: RE | Admit: 2022-12-29 | Discharge: 2022-12-29 | Disposition: A | Discharge status: Home / Self Care | Payer: Medicare HMO | Source: Ambulatory Visit | Attending: Nurse Practitioner | Admitting: Nurse Practitioner

## 2022-12-29 DIAGNOSIS — Z1231 Encounter for screening mammogram for malignant neoplasm of breast: Secondary | ICD-10-CM

## 2023-04-30 ENCOUNTER — Other Ambulatory Visit: Payer: Self-pay

## 2023-04-30 ENCOUNTER — Encounter (HOSPITAL_COMMUNITY): Payer: Self-pay

## 2023-04-30 ENCOUNTER — Emergency Department (HOSPITAL_COMMUNITY): Payer: Medicare HMO

## 2023-04-30 ENCOUNTER — Inpatient Hospital Stay (HOSPITAL_COMMUNITY): Payer: Medicare HMO

## 2023-04-30 ENCOUNTER — Observation Stay (HOSPITAL_COMMUNITY)
Admission: EM | Admit: 2023-04-30 | Discharge: 2023-05-03 | Disposition: A | Discharge status: Home / Self Care | Payer: Medicare HMO | Attending: Internal Medicine | Admitting: Internal Medicine

## 2023-04-30 DIAGNOSIS — Z79899 Other long term (current) drug therapy: Secondary | ICD-10-CM | POA: Diagnosis not present

## 2023-04-30 DIAGNOSIS — F419 Anxiety disorder, unspecified: Secondary | ICD-10-CM | POA: Diagnosis present

## 2023-04-30 DIAGNOSIS — M160 Bilateral primary osteoarthritis of hip: Secondary | ICD-10-CM | POA: Diagnosis present

## 2023-04-30 DIAGNOSIS — M549 Dorsalgia, unspecified: Secondary | ICD-10-CM | POA: Diagnosis present

## 2023-04-30 DIAGNOSIS — Z7951 Long term (current) use of inhaled steroids: Secondary | ICD-10-CM | POA: Diagnosis not present

## 2023-04-30 DIAGNOSIS — Z880 Allergy status to penicillin: Secondary | ICD-10-CM

## 2023-04-30 DIAGNOSIS — I10 Essential (primary) hypertension: Secondary | ICD-10-CM | POA: Diagnosis not present

## 2023-04-30 DIAGNOSIS — Z8616 Personal history of COVID-19: Secondary | ICD-10-CM

## 2023-04-30 DIAGNOSIS — R1012 Left upper quadrant pain: Secondary | ICD-10-CM | POA: Insufficient documentation

## 2023-04-30 DIAGNOSIS — N3010 Interstitial cystitis (chronic) without hematuria: Secondary | ICD-10-CM | POA: Diagnosis present

## 2023-04-30 DIAGNOSIS — F17210 Nicotine dependence, cigarettes, uncomplicated: Secondary | ICD-10-CM | POA: Diagnosis not present

## 2023-04-30 DIAGNOSIS — F4310 Post-traumatic stress disorder, unspecified: Secondary | ICD-10-CM | POA: Diagnosis present

## 2023-04-30 DIAGNOSIS — K219 Gastro-esophageal reflux disease without esophagitis: Secondary | ICD-10-CM | POA: Diagnosis present

## 2023-04-30 DIAGNOSIS — J9601 Acute respiratory failure with hypoxia: Secondary | ICD-10-CM | POA: Diagnosis present

## 2023-04-30 DIAGNOSIS — Z6827 Body mass index (BMI) 27.0-27.9, adult: Secondary | ICD-10-CM

## 2023-04-30 DIAGNOSIS — E441 Mild protein-calorie malnutrition: Secondary | ICD-10-CM | POA: Diagnosis not present

## 2023-04-30 DIAGNOSIS — E669 Obesity, unspecified: Secondary | ICD-10-CM | POA: Diagnosis present

## 2023-04-30 DIAGNOSIS — Z981 Arthrodesis status: Secondary | ICD-10-CM

## 2023-04-30 DIAGNOSIS — J441 Chronic obstructive pulmonary disease with (acute) exacerbation: Secondary | ICD-10-CM | POA: Diagnosis not present

## 2023-04-30 DIAGNOSIS — Z79890 Hormone replacement therapy (postmenopausal): Secondary | ICD-10-CM

## 2023-04-30 DIAGNOSIS — F17200 Nicotine dependence, unspecified, uncomplicated: Secondary | ICD-10-CM | POA: Diagnosis present

## 2023-04-30 DIAGNOSIS — F32A Depression, unspecified: Secondary | ICD-10-CM | POA: Diagnosis not present

## 2023-04-30 DIAGNOSIS — G8929 Other chronic pain: Secondary | ICD-10-CM | POA: Diagnosis present

## 2023-04-30 DIAGNOSIS — R1900 Intra-abdominal and pelvic swelling, mass and lump, unspecified site: Secondary | ICD-10-CM | POA: Insufficient documentation

## 2023-04-30 DIAGNOSIS — D735 Infarction of spleen: Secondary | ICD-10-CM | POA: Insufficient documentation

## 2023-04-30 DIAGNOSIS — Z716 Tobacco abuse counseling: Secondary | ICD-10-CM

## 2023-04-30 DIAGNOSIS — R296 Repeated falls: Secondary | ICD-10-CM | POA: Diagnosis present

## 2023-04-30 DIAGNOSIS — Z7984 Long term (current) use of oral hypoglycemic drugs: Secondary | ICD-10-CM

## 2023-04-30 DIAGNOSIS — J9611 Chronic respiratory failure with hypoxia: Secondary | ICD-10-CM | POA: Diagnosis not present

## 2023-04-30 DIAGNOSIS — E119 Type 2 diabetes mellitus without complications: Secondary | ICD-10-CM | POA: Insufficient documentation

## 2023-04-30 DIAGNOSIS — Z90710 Acquired absence of both cervix and uterus: Secondary | ICD-10-CM

## 2023-04-30 DIAGNOSIS — E785 Hyperlipidemia, unspecified: Secondary | ICD-10-CM | POA: Diagnosis present

## 2023-04-30 DIAGNOSIS — Z885 Allergy status to narcotic agent status: Secondary | ICD-10-CM

## 2023-04-30 DIAGNOSIS — R051 Acute cough: Secondary | ICD-10-CM | POA: Diagnosis present

## 2023-04-30 DIAGNOSIS — Z8701 Personal history of pneumonia (recurrent): Secondary | ICD-10-CM

## 2023-04-30 DIAGNOSIS — F900 Attention-deficit hyperactivity disorder, predominantly inattentive type: Secondary | ICD-10-CM | POA: Insufficient documentation

## 2023-04-30 LAB — COMPREHENSIVE METABOLIC PANEL
ALT: 9 U/L (ref 0–44)
AST: 11 U/L — ABNORMAL LOW (ref 15–41)
Albumin: 3.3 g/dL — ABNORMAL LOW (ref 3.5–5.0)
Alkaline Phosphatase: 82 U/L (ref 38–126)
Anion gap: 9 (ref 5–15)
BUN: 10 mg/dL (ref 6–20)
CO2: 23 mmol/L (ref 22–32)
Calcium: 8.8 mg/dL — ABNORMAL LOW (ref 8.9–10.3)
Chloride: 106 mmol/L (ref 98–111)
Creatinine, Ser: 0.97 mg/dL (ref 0.44–1.00)
GFR, Estimated: >60 mL/min (ref >60)
Glucose, Bld: 84 mg/dL (ref 70–99)
Potassium: 3.7 mmol/L (ref 3.5–5.1)
Sodium: 138 mmol/L (ref 135–145)
Total Bilirubin: 0.5 mg/dL (ref 0.3–1.2)
Total Protein: 7.4 g/dL (ref 6.5–8.1)

## 2023-04-30 LAB — CBC WITH DIFFERENTIAL/PLATELET
Abs Immature Granulocytes: 0.06 10*3/uL (ref 0.00–0.07)
Basophils Absolute: 0.1 10*3/uL (ref 0.0–0.1)
Basophils Relative: 1 %
Eosinophils Absolute: 0.1 10*3/uL (ref 0.0–0.5)
Eosinophils Relative: 1 %
HCT: 36.4 % (ref 36.0–46.0)
Hemoglobin: 11.5 g/dL — ABNORMAL LOW (ref 12.0–15.0)
Immature Granulocytes: 1 %
Lymphocytes Relative: 31 %
Lymphs Abs: 3.8 10*3/uL (ref 0.7–4.0)
MCH: 27.3 pg (ref 26.0–34.0)
MCHC: 31.6 g/dL (ref 30.0–36.0)
MCV: 86.5 fL (ref 80.0–100.0)
Monocytes Absolute: 0.9 10*3/uL (ref 0.1–1.0)
Monocytes Relative: 7 %
Neutro Abs: 7.3 10*3/uL (ref 1.7–7.7)
Neutrophils Relative %: 59 %
Platelets: 391 10*3/uL (ref 150–400)
RBC: 4.21 MIL/uL (ref 3.87–5.11)
RDW: 17.6 % — ABNORMAL HIGH (ref 11.5–15.5)
WBC: 12.1 10*3/uL — ABNORMAL HIGH (ref 4.0–10.5)
nRBC: 0 % (ref 0.0–0.2)

## 2023-04-30 LAB — URINALYSIS, ROUTINE W REFLEX MICROSCOPIC
Bilirubin Urine: NEGATIVE
Glucose, UA: NEGATIVE mg/dL
Ketones, ur: NEGATIVE mg/dL
Leukocytes,Ua: NEGATIVE
Nitrite: NEGATIVE
Protein, ur: NEGATIVE mg/dL
Specific Gravity, Urine: 1.009 (ref 1.005–1.030)
pH: 6 (ref 5.0–8.0)

## 2023-04-30 LAB — GLUCOSE, CAPILLARY: Glucose-Capillary: 369 mg/dL — ABNORMAL HIGH (ref 70–99)

## 2023-04-30 LAB — LIPASE, BLOOD: Lipase: 27 U/L (ref 11–51)

## 2023-04-30 MED ORDER — IPRATROPIUM-ALBUTEROL 0.5-2.5 (3) MG/3ML IN SOLN: 3.0000 mL | Freq: Four times a day (QID) | RESPIRATORY_TRACT | Status: DC

## 2023-04-30 MED ORDER — NABUMETONE 500 MG PO TABS
750.0000 mg | ORAL_TABLET | Freq: Two times a day (BID) | ORAL | Status: DC
  Administered 2023-04-30 – 2023-05-03 (×6): 750 mg via ORAL
  Filled 2023-04-30 (×6): qty 2

## 2023-04-30 MED ORDER — SODIUM CHLORIDE (PF) 0.9 % IJ SOLN
INTRAMUSCULAR | Status: AC
  Filled 2023-04-30: qty 50

## 2023-04-30 MED ORDER — PANTOPRAZOLE SODIUM 40 MG PO TBEC
40.0000 mg | DELAYED_RELEASE_TABLET | Freq: Every day | ORAL | Status: DC
  Administered 2023-04-30 – 2023-05-03 (×4): 40 mg via ORAL
  Filled 2023-04-30 (×4): qty 1

## 2023-04-30 MED ORDER — ENOXAPARIN SODIUM 40 MG/0.4ML IJ SOSY
40.0000 mg | PREFILLED_SYRINGE | INTRAMUSCULAR | Status: DC
  Administered 2023-04-30 – 2023-05-02 (×3): 40 mg via SUBCUTANEOUS
  Filled 2023-04-30 (×3): qty 0.4

## 2023-04-30 MED ORDER — IOHEXOL 350 MG/ML SOLN
100.0000 mL | Freq: Once | INTRAVENOUS | Status: AC | PRN
  Administered 2023-04-30: 100 mL via INTRAVENOUS

## 2023-04-30 MED ORDER — LORATADINE 10 MG PO TABS
10.0000 mg | ORAL_TABLET | Freq: Every day | ORAL | Status: DC
  Administered 2023-04-30 – 2023-05-03 (×4): 10 mg via ORAL
  Filled 2023-04-30 (×4): qty 1

## 2023-04-30 MED ORDER — FAMOTIDINE IN NACL 20-0.9 MG/50ML-% IV SOLN
20.0000 mg | Freq: Once | INTRAVENOUS | Status: AC
  Administered 2023-04-30: 20 mg via INTRAVENOUS
  Filled 2023-04-30: qty 50

## 2023-04-30 MED ORDER — AMPHETAMINE-DEXTROAMPHETAMINE 30 MG PO TABS: 30.0000 mg | ORAL_TABLET | Freq: Two times a day (BID) | ORAL | Status: DC

## 2023-04-30 MED ORDER — FLUOXETINE HCL 20 MG PO CAPS
80.0000 mg | ORAL_CAPSULE | Freq: Every day | ORAL | Status: DC
  Administered 2023-05-01 – 2023-05-03 (×3): 80 mg via ORAL
  Filled 2023-04-30 (×3): qty 4

## 2023-04-30 MED ORDER — LISINOPRIL 10 MG PO TABS
20.0000 mg | ORAL_TABLET | Freq: Every day | ORAL | Status: DC
  Administered 2023-04-30 – 2023-05-02 (×3): 20 mg via ORAL
  Filled 2023-04-30 (×3): qty 2

## 2023-04-30 MED ORDER — PREDNISONE 20 MG PO TABS
40.0000 mg | ORAL_TABLET | Freq: Every day | ORAL | Status: DC
  Administered 2023-05-01 – 2023-05-03 (×3): 40 mg via ORAL
  Filled 2023-04-30 (×3): qty 2

## 2023-04-30 MED ORDER — HYDROXYZINE HCL 25 MG PO TABS: 50.0000 mg | ORAL_TABLET | Freq: Every day | ORAL | Status: DC

## 2023-04-30 MED ORDER — ATORVASTATIN CALCIUM 10 MG PO TABS
20.0000 mg | ORAL_TABLET | Freq: Every day | ORAL | Status: DC
  Administered 2023-04-30 – 2023-05-03 (×4): 20 mg via ORAL
  Filled 2023-04-30 (×4): qty 2

## 2023-04-30 MED ORDER — OXYCODONE HCL 5 MG PO TABS
5.0000 mg | ORAL_TABLET | ORAL | Status: DC | PRN
  Administered 2023-04-30 – 2023-05-02 (×7): 5 mg via ORAL
  Filled 2023-04-30 (×6): qty 1

## 2023-04-30 MED ORDER — IPRATROPIUM-ALBUTEROL 0.5-2.5 (3) MG/3ML IN SOLN
3.0000 mL | Freq: Once | RESPIRATORY_TRACT | Status: AC
  Administered 2023-04-30: 3 mL via RESPIRATORY_TRACT
  Filled 2023-04-30: qty 3

## 2023-04-30 MED ORDER — IPRATROPIUM-ALBUTEROL 0.5-2.5 (3) MG/3ML IN SOLN
3.0000 mL | Freq: Four times a day (QID) | RESPIRATORY_TRACT | Status: DC
  Administered 2023-04-30 – 2023-05-01 (×2): 3 mL via RESPIRATORY_TRACT
  Filled 2023-04-30 (×2): qty 3

## 2023-04-30 MED ORDER — PANTOPRAZOLE SODIUM 40 MG IV SOLR
40.0000 mg | Freq: Once | INTRAVENOUS | Status: AC
  Administered 2023-04-30: 40 mg via INTRAVENOUS
  Filled 2023-04-30: qty 10

## 2023-04-30 MED ORDER — INSULIN ASPART 100 UNIT/ML IJ SOLN
7.0000 [IU] | Freq: Once | INTRAMUSCULAR | Status: AC
  Administered 2023-04-30: 7 [IU] via SUBCUTANEOUS

## 2023-04-30 MED ORDER — HYDROXYZINE HCL 25 MG PO TABS
50.0000 mg | ORAL_TABLET | Freq: Four times a day (QID) | ORAL | Status: DC | PRN
  Administered 2023-04-30 – 2023-05-01 (×2): 50 mg via ORAL
  Filled 2023-04-30 (×2): qty 2

## 2023-04-30 MED ORDER — ACETAMINOPHEN 325 MG PO TABS
650.0000 mg | ORAL_TABLET | Freq: Four times a day (QID) | ORAL | Status: DC | PRN
  Administered 2023-05-02: 650 mg via ORAL
  Filled 2023-04-30: qty 2

## 2023-04-30 MED ORDER — HYDROMORPHONE HCL 1 MG/ML IJ SOLN
1.0000 mg | Freq: Once | INTRAMUSCULAR | Status: AC
  Administered 2023-04-30: 1 mg via INTRAVENOUS
  Filled 2023-04-30: qty 1

## 2023-04-30 MED ORDER — ONDANSETRON HCL 4 MG/2ML IJ SOLN: 4.0000 mg | Freq: Four times a day (QID) | INTRAMUSCULAR | Status: DC | PRN

## 2023-04-30 MED ORDER — IOHEXOL 300 MG/ML  SOLN
100.0000 mL | Freq: Once | INTRAMUSCULAR | Status: AC | PRN
  Administered 2023-04-30: 100 mL via INTRAVENOUS

## 2023-04-30 MED ORDER — SODIUM CHLORIDE 0.9 % IV BOLUS
1000.0000 mL | Freq: Once | INTRAVENOUS | Status: AC
  Administered 2023-04-30: 1000 mL via INTRAVENOUS

## 2023-04-30 MED ORDER — HYDROMORPHONE HCL 1 MG/ML IJ SOLN
0.5000 mg | Freq: Once | INTRAMUSCULAR | Status: AC
  Administered 2023-04-30: 0.5 mg via INTRAVENOUS
  Filled 2023-04-30: qty 1

## 2023-04-30 MED ORDER — INSULIN ASPART 100 UNIT/ML IJ SOLN
0.0000 [IU] | Freq: Three times a day (TID) | INTRAMUSCULAR | Status: DC
  Administered 2023-05-01: 5 [IU] via SUBCUTANEOUS
  Administered 2023-05-01: 8 [IU] via SUBCUTANEOUS
  Administered 2023-05-01: 3 [IU] via SUBCUTANEOUS
  Administered 2023-05-02: 2 [IU] via SUBCUTANEOUS
  Administered 2023-05-02: 5 [IU] via SUBCUTANEOUS

## 2023-04-30 MED ORDER — IPRATROPIUM-ALBUTEROL 0.5-2.5 (3) MG/3ML IN SOLN: 3.0000 mL | Freq: Once | RESPIRATORY_TRACT | Status: DC

## 2023-04-30 MED ORDER — ONDANSETRON HCL 4 MG/2ML IJ SOLN
4.0000 mg | Freq: Once | INTRAMUSCULAR | Status: AC
  Administered 2023-04-30: 4 mg via INTRAVENOUS
  Filled 2023-04-30: qty 2

## 2023-04-30 MED ORDER — ONDANSETRON HCL 4 MG PO TABS: 4.0000 mg | ORAL_TABLET | Freq: Four times a day (QID) | ORAL | Status: DC | PRN

## 2023-04-30 MED ORDER — ALPRAZOLAM 0.5 MG PO TABS
1.0000 mg | ORAL_TABLET | Freq: Three times a day (TID) | ORAL | Status: DC | PRN
  Administered 2023-04-30 – 2023-05-01 (×3): 1 mg via ORAL
  Filled 2023-04-30 (×3): qty 2

## 2023-04-30 MED ORDER — NICOTINE 21 MG/24HR TD PT24
21.0000 mg | MEDICATED_PATCH | Freq: Every day | TRANSDERMAL | Status: DC
  Administered 2023-04-30 – 2023-05-03 (×4): 21 mg via TRANSDERMAL
  Filled 2023-04-30 (×4): qty 1

## 2023-04-30 MED ORDER — ACETAMINOPHEN 650 MG RE SUPP: 650.0000 mg | Freq: Four times a day (QID) | RECTAL | Status: DC | PRN

## 2023-04-30 MED ORDER — NICOTINE 21 MG/24HR TD PT24: 21.0000 mg | MEDICATED_PATCH | Freq: Every day | TRANSDERMAL | Status: DC | PRN

## 2023-04-30 MED ORDER — METHYLPREDNISOLONE SODIUM SUCC 125 MG IJ SOLR
62.5000 mg | Freq: Once | INTRAMUSCULAR | Status: AC
  Administered 2023-04-30: 62.5 mg via INTRAVENOUS
  Filled 2023-04-30: qty 2
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
## 2023-04-30 NOTE — ED Notes (Signed)
Pt saturations were going down to 87 and so I put her on 2L of Oxygen which kept it at 94. When she fell asleep, it went down to 90 and so I put her on 4L of oxygen
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
## 2023-04-30 NOTE — ED Triage Notes (Addendum)
Patient has left upper abdominal pain x4 days. Alternating between constipation and diarrhea. Vomits every day.

## 2023-05-01 ENCOUNTER — Inpatient Hospital Stay (HOSPITAL_COMMUNITY): Payer: Medicare HMO

## 2023-05-01 DIAGNOSIS — J9601 Acute respiratory failure with hypoxia: Secondary | ICD-10-CM

## 2023-05-01 DIAGNOSIS — D735 Infarction of spleen: Secondary | ICD-10-CM | POA: Diagnosis not present

## 2023-05-01 DIAGNOSIS — J441 Chronic obstructive pulmonary disease with (acute) exacerbation: Secondary | ICD-10-CM | POA: Diagnosis not present

## 2023-05-01 LAB — ECHOCARDIOGRAM COMPLETE
Area-P 1/2: 3.33 cm2
Calc EF: 62.6 %
Height: 66 in
S' Lateral: 2.7 cm
Single Plane A2C EF: 60.8 %
Single Plane A4C EF: 64.6 %
Weight: 2768 oz

## 2023-05-01 LAB — CBC
HCT: 33.8 % — ABNORMAL LOW (ref 36.0–46.0)
Hemoglobin: 10.7 g/dL — ABNORMAL LOW (ref 12.0–15.0)
MCH: 27 pg (ref 26.0–34.0)
MCHC: 31.7 g/dL (ref 30.0–36.0)
MCV: 85.4 fL (ref 80.0–100.0)
Platelets: 368 10*3/uL (ref 150–400)
RBC: 3.96 MIL/uL (ref 3.87–5.11)
RDW: 17.2 % — ABNORMAL HIGH (ref 11.5–15.5)
WBC: 10 10*3/uL (ref 4.0–10.5)
nRBC: 0 % (ref 0.0–0.2)

## 2023-05-01 LAB — COMPREHENSIVE METABOLIC PANEL
ALT: 8 U/L (ref 0–44)
AST: 11 U/L — ABNORMAL LOW (ref 15–41)
Albumin: 2.9 g/dL — ABNORMAL LOW (ref 3.5–5.0)
Alkaline Phosphatase: 74 U/L (ref 38–126)
Anion gap: 9 (ref 5–15)
BUN: 10 mg/dL (ref 6–20)
CO2: 21 mmol/L — ABNORMAL LOW (ref 22–32)
Calcium: 8.5 mg/dL — ABNORMAL LOW (ref 8.9–10.3)
Chloride: 105 mmol/L (ref 98–111)
Creatinine, Ser: 0.99 mg/dL (ref 0.44–1.00)
GFR, Estimated: >60 mL/min (ref >60)
Glucose, Bld: 215 mg/dL — ABNORMAL HIGH (ref 70–99)
Potassium: 3.7 mmol/L (ref 3.5–5.1)
Sodium: 135 mmol/L (ref 135–145)
Total Bilirubin: 0.4 mg/dL (ref 0.3–1.2)
Total Protein: 6.6 g/dL (ref 6.5–8.1)

## 2023-05-01 LAB — HEMOGLOBIN A1C
Hgb A1c MFr Bld: 5.8 % — ABNORMAL HIGH (ref 4.8–5.6)
Mean Plasma Glucose: 119.76 mg/dL

## 2023-05-01 LAB — GLUCOSE, CAPILLARY
Glucose-Capillary: 204 mg/dL — ABNORMAL HIGH (ref 70–99)
Glucose-Capillary: 174 mg/dL — ABNORMAL HIGH (ref 70–99)
Glucose-Capillary: 265 mg/dL — ABNORMAL HIGH (ref 70–99)
Glucose-Capillary: 199 mg/dL — ABNORMAL HIGH (ref 70–99)

## 2023-05-01 LAB — HIV ANTIBODY (ROUTINE TESTING W REFLEX): HIV Screen 4th Generation wRfx: NONREACTIVE

## 2023-05-01 MED ORDER — FLUTICASONE FUROATE-VILANTEROL 200-25 MCG/ACT IN AEPB
1.0000 | INHALATION_SPRAY | Freq: Every day | RESPIRATORY_TRACT | Status: DC
  Administered 2023-05-01 – 2023-05-03 (×2): 1 via RESPIRATORY_TRACT
  Filled 2023-05-01: qty 28

## 2023-05-01 MED ORDER — IPRATROPIUM-ALBUTEROL 0.5-2.5 (3) MG/3ML IN SOLN
3.0000 mL | Freq: Three times a day (TID) | RESPIRATORY_TRACT | Status: DC
  Administered 2023-05-01 – 2023-05-03 (×4): 3 mL via RESPIRATORY_TRACT
  Filled 2023-05-01 (×6): qty 3

## 2023-05-01 MED ORDER — FLUTICASONE PROPIONATE 50 MCG/ACT NA SUSP
1.0000 | Freq: Every evening | NASAL | Status: DC
  Administered 2023-05-01 – 2023-05-02 (×2): 1 via NASAL
  Filled 2023-05-01: qty 16

## 2023-05-01 MED ORDER — ORAL CARE MOUTH RINSE: 15.0000 mL | OROMUCOSAL | Status: DC | PRN

## 2023-05-01 NOTE — Progress Notes (Signed)
Echocardiogram 2D Echocardiogram has been performed.  Toni Amend 05/01/2023, 8:27 AM
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
## 2023-05-01 NOTE — TOC CM/SW Note (Signed)
Transition of Care [facility]) Screening Note  Patient Details  Name: Alison Kim Date of Birth: 1966-10-21  Transition of Care [facility]) CM/SW Contact:    Ewing Schlein, LCSW Phone Number: 05/01/2023, 1:28 PM  Transition of Care Department [facility]) has reviewed patient and no TOC needs have been identified at this time. We will continue to monitor patient advancement through interdisciplinary progression rounds. If new patient transition needs arise, please place a TOC consult.

## 2023-05-02 DIAGNOSIS — J441 Chronic obstructive pulmonary disease with (acute) exacerbation: Secondary | ICD-10-CM | POA: Diagnosis not present

## 2023-05-02 LAB — GLUCOSE, CAPILLARY
Glucose-Capillary: 113 mg/dL — ABNORMAL HIGH (ref 70–99)
Glucose-Capillary: 140 mg/dL — ABNORMAL HIGH (ref 70–99)
Glucose-Capillary: 231 mg/dL — ABNORMAL HIGH (ref 70–99)
Glucose-Capillary: 149 mg/dL — ABNORMAL HIGH (ref 70–99)

## 2023-05-02 MED ORDER — HYDRALAZINE HCL 20 MG/ML IJ SOLN
5.0000 mg | Freq: Four times a day (QID) | INTRAMUSCULAR | Status: AC | PRN
  Administered 2023-05-02 – 2023-05-03 (×2): 5 mg via INTRAVENOUS
  Filled 2023-05-02 (×2): qty 1

## 2023-05-02 MED ORDER — SENNOSIDES-DOCUSATE SODIUM 8.6-50 MG PO TABS: 1.0000 | ORAL_TABLET | Freq: Every evening | ORAL | Status: DC | PRN

## 2023-05-02 MED ORDER — METOCLOPRAMIDE HCL 5 MG/ML IJ SOLN
5.0000 mg | Freq: Once | INTRAMUSCULAR | Status: AC
  Administered 2023-05-02: 5 mg via INTRAVENOUS
  Filled 2023-05-02: qty 2

## 2023-05-02 MED ORDER — METOCLOPRAMIDE HCL 10 MG PO TABS
5.0000 mg | ORAL_TABLET | Freq: Four times a day (QID) | ORAL | Status: DC | PRN
  Administered 2023-05-02: 5 mg via ORAL
  Filled 2023-05-02: qty 1

## 2023-05-02 MED ORDER — POLYETHYLENE GLYCOL 3350 17 G PO PACK
17.0000 g | PACK | Freq: Every day | ORAL | Status: DC
  Administered 2023-05-02 – 2023-05-03 (×2): 17 g via ORAL
  Filled 2023-05-02 (×2): qty 1

## 2023-05-02 NOTE — Progress Notes (Signed)
Patient able to ambulate entire unit on room air while maintaining O2 sats at 95%.
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
## 2023-05-03 ENCOUNTER — Encounter: Payer: Self-pay | Admitting: *Deleted

## 2023-05-03 ENCOUNTER — Ambulatory Visit (INDEPENDENT_AMBULATORY_CARE_PROVIDER_SITE_OTHER): Payer: Medicare HMO

## 2023-05-03 ENCOUNTER — Other Ambulatory Visit: Payer: Self-pay | Admitting: Home Health

## 2023-05-03 DIAGNOSIS — I480 Paroxysmal atrial fibrillation: Secondary | ICD-10-CM

## 2023-05-03 DIAGNOSIS — J441 Chronic obstructive pulmonary disease with (acute) exacerbation: Secondary | ICD-10-CM | POA: Diagnosis not present

## 2023-05-03 DIAGNOSIS — D735 Infarction of spleen: Secondary | ICD-10-CM

## 2023-05-03 LAB — GLUCOSE, CAPILLARY: Glucose-Capillary: 84 mg/dL (ref 70–99)

## 2023-05-03 MED ORDER — PREDNISONE 20 MG PO TABS: 40.0000 mg | ORAL_TABLET | Freq: Every day | ORAL | 0 refills | Status: AC

## 2023-05-03 MED ORDER — OXYCODONE HCL 5 MG PO TABS: 5.0000 mg | ORAL_TABLET | ORAL | 0 refills | Status: AC | PRN

## 2023-05-03 MED ORDER — LISINOPRIL 40 MG PO TABS: 40.0000 mg | ORAL_TABLET | Freq: Every day | ORAL | 0 refills | Status: AC

## 2023-05-03 MED ORDER — LISINOPRIL 20 MG PO TABS
40.0000 mg | ORAL_TABLET | Freq: Every day | ORAL | Status: DC
  Administered 2023-05-03: 40 mg via ORAL
  Filled 2023-05-03: qty 2

## 2023-05-03 MED ORDER — POLYETHYLENE GLYCOL 3350 17 G PO PACK: 17.0000 g | PACK | Freq: Every day | ORAL | 0 refills | Status: AC

## 2023-05-03 MED ORDER — SENNOSIDES-DOCUSATE SODIUM 8.6-50 MG PO TABS: 1.0000 | ORAL_TABLET | Freq: Every evening | ORAL | Status: AC | PRN

## 2023-05-03 NOTE — Progress Notes (Signed)
2 week Zio ordered for evaluation of A fib per Dr Benjamine Mola request, for splenic infarct, Dr Izora Ribas to see in the office as new patient.
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
## 2023-05-03 NOTE — Progress Notes (Unsigned)
Enrolled for [employer] to mail a ZIO XT long term holter monitor to the patients address on file.  

## 2023-05-03 NOTE — Discharge Instructions (Signed)
Your event monitor will be mailed to you

## 2023-05-06 DIAGNOSIS — D735 Infarction of spleen: Secondary | ICD-10-CM | POA: Diagnosis not present

## 2023-05-06 DIAGNOSIS — I480 Paroxysmal atrial fibrillation: Secondary | ICD-10-CM

## 2023-06-03 ENCOUNTER — Telehealth: Payer: Self-pay
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
## 2023-06-22 ENCOUNTER — Encounter: Payer: Self-pay | Admitting: Vascular Surgery

## 2023-06-22 ENCOUNTER — Ambulatory Visit (INDEPENDENT_AMBULATORY_CARE_PROVIDER_SITE_OTHER): Payer: Medicare HMO | Admitting: Vascular Surgery

## 2023-06-22 VITALS — BP 170/104 | HR 90 | Temp 98.7°F | Resp 20 | Ht 66.0 in | Wt 174.0 lb

## 2023-06-22 DIAGNOSIS — D735 Infarction of spleen: Secondary | ICD-10-CM | POA: Diagnosis not present

## 2023-06-22 MED ORDER — NICOTINE 21 MG/24HR TD PT24: 21.0000 mg | MEDICATED_PATCH | Freq: Every day | TRANSDERMAL | 5 refills | Status: AC

## 2023-07-07 ENCOUNTER — Ambulatory Visit: Payer: Medicare HMO | Attending: Internal Medicine | Admitting: Internal Medicine

## 2023-07-07 NOTE — Progress Notes (Deleted)
Cardiology Office Note:  .    Date:  07/07/2023  ID:  Alison Kim, DOB 09-22-1966, MRN [id number] PCP: Diamantina Providence, FNP  [facility] Providers Cardiologist:  None { Click to update primary MD,subspecialty MD or APP then REFRESH:1}    CC: *** Consulted for the evaluation of *** at the behest of ***  History of Present Illness: .    Alison Kim is a 57 y.o. female hx of SVT on heart monitor without AF/AFL, query of stroke per referral.  Had splenic infarct as well with atherosclerosis of the vessel.  Patient notes that she is doing ***.     EKG showed ***  No chest pain or pressure ***.  No SOB/DOE*** and no PND/Orthopnea***.  No weight gain or leg swelling***.  No palpitations or syncope ***.  Ambulatory blood pressure ***.     Relevant histories: .  Social *** ROS: As per HPI.   Studies Reviewed: .   Cardiac Studies & Procedures       ECHOCARDIOGRAM  ECHOCARDIOGRAM COMPLETE 05/01/2023  Narrative ECHOCARDIOGRAM REPORT    Patient Name:   Alison Kim Date of Exam: 05/01/2023 Medical Rec #:  [id number]        Height:       66.0 in Accession #:    [id number]       Weight:       173.0 lb Date of Birth:  [birth year]       BSA:          1.880 m Patient Age:    56 years         BP:           149/94 mmHg Patient Gender: F                HR:           96 bpm. Exam Location:  Inpatient  Procedure: 2D Echo, Cardiac Doppler and Color Doppler  Indications:    splenic infarct  History:        Patient has no prior history of Echocardiogram examinations. COPD; Risk Factors:Hypertension, Diabetes and Dyslipidemia.  Sonographer:    Mike Gip Referring Phys: [id number] SAMUEL A GRAY  IMPRESSIONS   1. Left ventricular ejection fraction, by estimation, is 60 to 65%. Left ventricular ejection fraction by 2D MOD biplane is 62.6 %. The left ventricle has normal function. The left ventricle has no regional wall motion abnormalities. There is  mild concentric left ventricular hypertrophy. Left ventricular diastolic parameters were normal. 2. Right ventricular systolic function is normal. The right ventricular size is normal. Tricuspid regurgitation signal is inadequate for assessing PA pressure. 3. The mitral valve is grossly normal. Trivial mitral valve regurgitation. No evidence of mitral stenosis. 4. The aortic valve is tricuspid. Aortic valve regurgitation is not visualized. No aortic stenosis is present. 5. The inferior vena cava is dilated in size with >50% respiratory variability, suggesting right atrial pressure of 8 mmHg.  FINDINGS Left Ventricle: Left ventricular ejection fraction, by estimation, is 60 to 65%. Left ventricular ejection fraction by 2D MOD biplane is 62.6 %. The left ventricle has normal function. The left ventricle has no regional wall motion abnormalities. The left ventricular internal cavity size was normal in size. There is mild concentric left ventricular hypertrophy. Left ventricular diastolic parameters were normal.  Right Ventricle: The right ventricular size is normal. No increase in right ventricular wall thickness. Right ventricular systolic function is normal. Tricuspid regurgitation signal  is inadequate for assessing PA pressure.  Left Atrium: Left atrial size was normal in size.  Right Atrium: Right atrial size was normal in size.  Pericardium: There is no evidence of pericardial effusion. Presence of epicardial fat layer.  Mitral Valve: The mitral valve is grossly normal. Trivial mitral valve regurgitation. No evidence of mitral valve stenosis.  Tricuspid Valve: The tricuspid valve is grossly normal. Tricuspid valve regurgitation is trivial. No evidence of tricuspid stenosis.  Aortic Valve: The aortic valve is tricuspid. Aortic valve regurgitation is not visualized. No aortic stenosis is present.  Pulmonic Valve: The pulmonic valve was grossly normal. Pulmonic valve regurgitation is not  visualized. No evidence of pulmonic stenosis.  Aorta: The aortic root and ascending aorta are structurally normal, with no evidence of dilitation.  Venous: The inferior vena cava is dilated in size with greater than 50% respiratory variability, suggesting right atrial pressure of 8 mmHg.  IAS/Shunts: The atrial septum is grossly normal.   LEFT VENTRICLE PLAX 2D                        Biplane EF (MOD) LVIDd:         4.60 cm         LV Biplane EF:   Left LVIDs:         2.70 cm                          ventricular LV PW:         1.10 cm                          ejection LV IVS:        1.30 cm                          fraction by LVOT diam:     2.10 cm                          2D MOD LV SV:         71                               biplane is LV SV Index:   38                               62.6 %. LVOT Area:     3.46 cm Diastology LV e' medial:    8.16 cm/s LV Volumes (MOD)               LV E/e' medial:  11.1 LV vol d, MOD    77.9 ml       LV e' lateral:   7.51 cm/s A2C:                           LV E/e' lateral: 12.1 LV vol d, MOD    76.6 ml A4C: LV vol s, MOD    30.5 ml A2C: LV vol s, MOD    27.1 ml A4C: LV SV MOD A2C:   47.4 ml LV SV MOD A4C:   76.6 ml LV SV MOD BP:    48.3 ml  RIGHT  VENTRICLE             IVC RV Basal diam:  3.20 cm     IVC diam: 2.10 cm RV S prime:     14.90 cm/s TAPSE (M-mode): 1.9 cm  LEFT ATRIUM             Index        RIGHT ATRIUM           Index LA diam:        3.20 cm 1.70 cm/m   RA Area:     10.90 cm LA Vol (A2C):   43.3 ml 23.03 ml/m  RA Volume:   22.00 ml  11.70 ml/m LA Vol (A4C):   31.9 ml 16.97 ml/m LA Biplane Vol: 39.3 ml 20.90 ml/m AORTIC VALVE LVOT Vmax:   94.20 cm/s LVOT Vmean:  65.400 cm/s LVOT VTI:    0.206 m  AORTA Ao Root diam: 3.70 cm Ao Asc diam:  3.10 cm  MITRAL VALVE MV Area (PHT): 3.33 cm     SHUNTS MV Decel Time: 228 msec     Systemic VTI:  0.21 m MV E velocity: 90.70 cm/s   Systemic Diam: 2.10 cm MV A  velocity: 110.00 cm/s MV E/A ratio:  0.82  Lennie Odor MD Electronically signed by Lennie Odor MD Signature Date/Time: 05/01/2023/11:21:16 AM    Final    MONITORS  LONG TERM MONITOR (3-14 DAYS) 05/28/2023  Narrative   Patient had a minimum heart rate of 51 bpm, maximum heart rate of 193 bpm, and average heart rate of 83 bpm.   Predominant underlying rhythm was sinus rhythm.   Several runs of SVT, longest lasting 20 seconds.   Isolated PACs were rare (<1.0%).   Isolated PVCs were rare (<1.0%).   Triggered and diary events associated with sinus rhythm.  Asymptomatic SVT.           *** Risk Assessment/Calculations:    {Does this patient have ATRIAL FIBRILLATION?:[phone]}       Physical Exam:    VS:  There were no vitals taken for this visit.   Wt Readings from Last 3 Encounters:  06/22/23 174 lb (78.9 kg)  04/30/23 173 lb (78.5 kg)  07/01/21 209 lb (94.8 kg)    Gen: *** distress, *** obese/well nourished/malnourished   Neck: No JVD, *** carotid bruit Ears: *** Frank Sign Cardiac: No Rubs or Gallops, *** Murmur, ***cardia, *** radial pulses Respiratory: Clear to auscultation bilaterally, *** effort, ***  respiratory rate GI: Soft, nontender, non-distended *** MS: No *** edema; *** moves all extremities Integument: Skin feels *** Neuro:  At time of evaluation, alert and oriented to person/place/time/situation *** Psych: Normal affect, patient feels ***   ASSESSMENT AND PLAN: .     SVT  Aortic Atheroslcerosis CAC  Smoking   Riley Lam, MD FASE [facility] Cardiologist [facility]  [address] [place], [address] [place], [place] [address] [phone]  7:51 AM

## 2023-07-08 ENCOUNTER — Encounter: Payer: Self-pay | Admitting: Internal Medicine

## 2024-02-01 NOTE — Telephone Encounter (Signed)
 Encounter not needed

## 2024-02-25 ENCOUNTER — Other Ambulatory Visit: Payer: Self-pay

## 2024-02-25 ENCOUNTER — Encounter (HOSPITAL_COMMUNITY): Payer: Self-pay

## 2024-02-25 ENCOUNTER — Emergency Department (HOSPITAL_BASED_OUTPATIENT_CLINIC_OR_DEPARTMENT_OTHER)
Admit: 2024-02-25 | Discharge: 2024-02-25 | Disposition: A | Discharge status: Home / Self Care | Attending: Emergency Medicine | Admitting: Emergency Medicine

## 2024-02-25 ENCOUNTER — Emergency Department (HOSPITAL_COMMUNITY)
Admission: EM | Admit: 2024-02-25 | Discharge: 2024-02-25 | Disposition: A | Discharge status: Home / Self Care | Attending: Emergency Medicine | Admitting: Emergency Medicine

## 2024-02-25 DIAGNOSIS — E119 Type 2 diabetes mellitus without complications: Secondary | ICD-10-CM | POA: Diagnosis not present

## 2024-02-25 DIAGNOSIS — J449 Chronic obstructive pulmonary disease, unspecified: Secondary | ICD-10-CM | POA: Insufficient documentation

## 2024-02-25 DIAGNOSIS — M79661 Pain in right lower leg: Secondary | ICD-10-CM

## 2024-02-25 DIAGNOSIS — M79605 Pain in left leg: Secondary | ICD-10-CM | POA: Insufficient documentation

## 2024-02-25 DIAGNOSIS — R000 Tachycardia, unspecified: Secondary | ICD-10-CM | POA: Diagnosis not present

## 2024-02-25 DIAGNOSIS — M79604 Pain in right leg: Secondary | ICD-10-CM | POA: Insufficient documentation

## 2024-02-25 MED ORDER — KETOROLAC TROMETHAMINE 15 MG/ML IJ SOLN
15.0000 mg | Freq: Once | INTRAMUSCULAR | Status: AC
  Administered 2024-02-25: 15 mg via INTRAMUSCULAR
  Filled 2024-02-25: qty 1

## 2024-02-25 MED ORDER — METHOCARBAMOL 500 MG PO TABS: 500.0000 mg | ORAL_TABLET | Freq: Two times a day (BID) | ORAL | 0 refills | Status: AC

## 2024-02-25 MED ORDER — OXYCODONE-ACETAMINOPHEN 5-325 MG PO TABS
1.0000 | ORAL_TABLET | Freq: Once | ORAL | Status: AC
  Administered 2024-02-25: 1 via ORAL
  Filled 2024-02-25: qty 1

## 2024-02-25 NOTE — Progress Notes (Signed)
 Bilateral lower extremity venous duplex has been completed. Preliminary results can be found in CV Proc through chart review.  Results were given to Luther Hearing PA.  02/25/24 1:40 PM Olen Cordial RVT

## 2024-02-25 NOTE — ED Provider Notes (Signed)
 [place] [facility] AT [facility] Provider Note   CSN: [id number] Arrival date & time: 02/25/24  1219     History  Chief Complaint  Patient presents with   Leg Pain    Alison Kim is a 58 y.o. 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  She denies any fall, or other injury.  She reports she has never felt this kind of pain in her knees before even despite her history of bursitis, she does report that she had similar symptoms when she had COVID recently.  She denies any upper respiratory infectious symptoms, fever, chills, she denies any nausea, vomiting, did not take anything for pain prior to arrival.  Denies any shortness of breath.   Leg Pain      Home Medications Prior to Admission medications   Medication Sig Start Date End Date Taking? Authorizing Provider  methocarbamol (ROBAXIN) 500 MG tablet Take 1 tablet (500 mg total) by mouth 2 (two) times daily. 02/25/24  Yes Buffie Herne H, PA-C  albuterol (VENTOLIN HFA) 108 (90 Base) MCG/ACT inhaler Inhale 2 puffs into the lungs every 6 (six) hours as needed for wheezing or shortness of breath. Use 3 times daily x3 days, then every 6 hours as needed. 07/06/21   Rodolph Bong, MD  ALPRAZolam Prudy Feeler) 1 MG tablet Take 1 mg by mouth 3 (three) times daily as needed for anxiety.    [provider]  atorvastatin (LIPITOR) 20 MG tablet Take 20 mg by mouth daily. 04/29/21   [provider]  esomeprazole (NEXIUM) 40 MG capsule Take 40 mg by mouth daily.    [provider]  FLUoxetine (PROZAC) 40 MG capsule Take 80 mg by mouth daily after breakfast.     [provider]  fluticasone (FLONASE) 50 MCG/ACT nasal spray Place 1 spray into both nostrils every evening. 12/05/14   [provider]  Fluticasone-Salmeterol  (ADVAIR) 500-50 MCG/DOSE AEPB Inhale 1 puff into the lungs 2 (two) times daily.    [provider]  hydrOXYzine (ATARAX/VISTARIL) 25 MG tablet Take 75 mg by mouth daily after breakfast.    [provider]  lisinopril (ZESTRIL) 40 MG tablet Take 1 tablet (40 mg total) by mouth daily. 05/03/23   Joseph Art, DO  loratadine (CLARITIN) 10 MG tablet Take 10 mg by mouth daily.    [provider]  nabumetone (RELAFEN) 750 MG tablet Take 1,500 mg by mouth daily after breakfast. 06/14/18   [provider]  nicotine (NICODERM CQ - DOSED IN MG/24 HOURS) 14 mg/24hr patch Place 1 patch (14 mg total) onto the skin daily. 07/07/21   Rodolph Bong, MD  nicotine (NICODERM CQ - DOSED IN MG/24 HOURS) 21 mg/24hr patch Place 1 patch (21 mg total) onto the skin daily. 06/22/23   Leonie Douglas, MD  oxyCODONE (OXY IR/ROXICODONE) 5 MG immediate release tablet Take 1 tablet (5 mg total) by mouth every 4 (four) hours as needed for breakthrough pain or moderate pain. 05/03/23   Joseph Art, DO  polyethylene glycol (MIRALAX / GLYCOLAX) 17 g packet Take 17 g by mouth daily. 05/03/23   Joseph Art, DO  senna-docusate (SENOKOT-S) 8.6-50 MG tablet Take 1 tablet by mouth at bedtime as needed for mild constipation. 05/03/23   Joseph Art, DO      Allergies  Demerol, Morphine and codeine, and Penicillins    Review of Systems   Review of Systems  All other systems reviewed and are negative.   Physical Exam Updated Vital Signs BP (!) 141/99 (BP Location: Right Arm)   Pulse (!) 107   Temp 98.4 F (36.9 C) (Oral)   Resp 18   Ht 5\' 6"  (1.676 m)   Wt 78.9 kg   SpO2 92%   BMI 28.08 kg/m  Physical Exam Vitals and nursing note reviewed.  Constitutional:      General: She is not in acute distress.    Appearance: Normal appearance.  HENT:     Head: Normocephalic and atraumatic.  Eyes:     General:        Right eye: No discharge.        Left eye: No discharge.   Cardiovascular:     Rate and Rhythm: Regular rhythm. Tachycardia present.     Heart sounds: No murmur heard.    No friction rub. No gallop.  Pulmonary:     Effort: Pulmonary effort is normal.     Breath sounds: Normal breath sounds.  Abdominal:     General: Bowel sounds are normal.     Palpations: Abdomen is soft.  Musculoskeletal:     Comments: Normal appearance bilateral lower extremities, normal strength, range of motion throughout.  Diffuse tenderness to palpation across knees, seemingly somewhat disproportionate to exam.  Skin:    General: Skin is warm and dry.     Capillary Refill: Capillary refill takes less than 2 seconds.  Neurological:     Mental Status: She is alert and oriented to person, place, and time.  Psychiatric:        Mood and Affect: Mood normal.        Behavior: Behavior normal.     ED Results / Procedures / Treatments   Labs (all labs ordered are listed, but only abnormal results are displayed) Labs Reviewed - No data to display  EKG None  Radiology VAS [place] LOWER EXTREMITY VENOUS (DVT) (7a-7p) Result Date: 02/25/2024  Lower Venous DVT Study Patient Name:  Alison Kim  Date of Exam:   02/25/2024 Medical Rec #: [id number]         Accession #:    [id number] Date of Birth: 15-Jun-1966        Patient Gender: F Patient Age:   59 years Exam Location:  [facility] Procedure:      VAS [place] LOWER EXTREMITY VENOUS (DVT) Referring Phys: Raeshawn Vo --------------------------------------------------------------------------------  Indications: Pain.  Risk Factors: None identified. Comparison Study: No prior studies. Performing Technologist: Chanda Busing RVT  Examination Guidelines: A complete evaluation includes B-mode imaging, spectral Doppler, color Doppler, and power Doppler as needed of all accessible portions of each vessel. Bilateral testing is considered an integral part of a complete examination. Limited examinations for reoccurring indications  may be performed as noted. The reflux portion of the exam is performed with the patient in reverse Trendelenburg.  +---------+---------------+---------+-----------+----------+--------------+ RIGHT    CompressibilityPhasicitySpontaneityPropertiesThrombus Aging +---------+---------------+---------+-----------+----------+--------------+ CFV      Full           Yes      Yes                                 +---------+---------------+---------+-----------+----------+--------------+ SFJ      Full                                                        +---------+---------------+---------+-----------+----------+--------------+  FV Prox  Full                                                        +---------+---------------+---------+-----------+----------+--------------+ FV Mid   Full                                                        +---------+---------------+---------+-----------+----------+--------------+ FV DistalFull                                                        +---------+---------------+---------+-----------+----------+--------------+ PFV      Full                                                        +---------+---------------+---------+-----------+----------+--------------+ POP      Full           Yes      Yes                                 +---------+---------------+---------+-----------+----------+--------------+ PTV      Full                                                        +---------+---------------+---------+-----------+----------+--------------+ PERO     Full                                                        +---------+---------------+---------+-----------+----------+--------------+   +---------+---------------+---------+-----------+----------+--------------+ LEFT     CompressibilityPhasicitySpontaneityPropertiesThrombus Aging +---------+---------------+---------+-----------+----------+--------------+ CFV       Full           Yes      Yes                                 +---------+---------------+---------+-----------+----------+--------------+ SFJ      Full                                                        +---------+---------------+---------+-----------+----------+--------------+ FV Prox  Full                                                        +---------+---------------+---------+-----------+----------+--------------+  FV Mid   Full                                                        +---------+---------------+---------+-----------+----------+--------------+ FV DistalFull                                                        +---------+---------------+---------+-----------+----------+--------------+ PFV      Full                                                        +---------+---------------+---------+-----------+----------+--------------+ POP      Full           Yes      Yes                                 +---------+---------------+---------+-----------+----------+--------------+ PTV      Full                                                        +---------+---------------+---------+-----------+----------+--------------+ PERO     Full                                                        +---------+---------------+---------+-----------+----------+--------------+     Summary: RIGHT: - There is no evidence of deep vein thrombosis in the lower extremity.  - No cystic structure found in the popliteal fossa.  LEFT: - There is no evidence of deep vein thrombosis in the lower extremity.  - No cystic structure found in the popliteal fossa.  *See table(s) above for measurements and observations.    Preliminary     Procedures Procedures    Medications Ordered in ED Medications  ketorolac (TORADOL) 15 MG/ML injection 15 mg (15 mg Intramuscular Given 02/25/24 1325)  oxyCODONE-acetaminophen (PERCOCET/ROXICET) 5-325 MG per tablet 1 tablet (1  tablet Oral Given 02/25/24 1327)    ED Course/ Medical Decision Making/ A&P                                 Medical Decision Making Amount and/or Complexity of Data Reviewed Labs: ordered.  Risk Prescription drug management.   This patient is a 58 y.o. female who presents to the ED for concern of bilateral leg pain.   Differential diagnoses prior to evaluation: Bursitis, muscular pain, DVT, superficial thrombophlebitis, versus other  Past Medical History / Social History / Additional history: Chart reviewed. 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 The pertinent  findings include: Normal appearance bilateral lower extremities, normal strength, range of motion throughout.  Diffuse tenderness to palpation across knees, seemingly somewhat disproportionate to exam  Mildly tachycardic but improved on recheck.  Neurovascularly intact throughout.  Strong DP, PT pulses 2+ in bilateral lower extremities.  Medications / Treatment: Patient was significant improvement of pain after Percocet, Toradol x 1.  She is able to ambulate without difficulty on reevaluation.  I independently interpreted vascular ultrasound of bilateral lower extremities which shows negative for DVT bilaterally.   Disposition: After consideration of the diagnostic results and the patients response to treatment, I feel that patient is stable for discharge with plan as above.   emergency department workup does not suggest an emergent condition requiring admission or immediate intervention beyond what has been performed at this time. The plan is: as above. The patient is safe for discharge and has been instructed to return immediately for worsening symptoms, change in symptoms or any other concerns.  Final Clinical Impression(s) / ED Diagnoses Final diagnoses:  Bilateral leg pain    Rx / DC Orders ED  Discharge Orders          Ordered    methocarbamol (ROBAXIN) 500 MG tablet  2 times daily        02/25/24 1442              Sherrica Niehaus, [place] H, PA-C 02/25/24 1445    Anders Simmonds T, DO 02/26/24 [id number]

## 2024-02-25 NOTE — Discharge Instructions (Addendum)

## 2024-02-25 NOTE — ED Triage Notes (Signed)
 Pt reports leg pain all night. Been up and down all night. Hx of blood clots. BIB EMS

## 2024-10-19 ENCOUNTER — Ambulatory Visit (HOSPITAL_COMMUNITY)
Admission: EM | Admit: 2024-10-19 | Discharge: 2024-10-19 | Disposition: A | Discharge status: Home / Self Care | Attending: Family Medicine | Admitting: Family Medicine

## 2024-10-19 ENCOUNTER — Encounter (HOSPITAL_COMMUNITY): Payer: Self-pay

## 2024-10-19 DIAGNOSIS — L089 Local infection of the skin and subcutaneous tissue, unspecified: Secondary | ICD-10-CM | POA: Diagnosis not present

## 2024-10-19 DIAGNOSIS — B9689 Other specified bacterial agents as the cause of diseases classified elsewhere: Secondary | ICD-10-CM

## 2024-10-19 MED ORDER — DOXYCYCLINE HYCLATE 100 MG PO CAPS: 100.0000 mg | ORAL_CAPSULE | Freq: Two times a day (BID) | ORAL | 0 refills | Status: AC

## 2024-10-19 MED ORDER — MUPIROCIN 2 % EX OINT: 1.0000 | TOPICAL_OINTMENT | Freq: Two times a day (BID) | CUTANEOUS | 0 refills | Status: AC

## 2024-10-19 NOTE — ED Triage Notes (Signed)
 Pt c/o red nose with pain and sinus pressure x1wk. Denies taking any meds.
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
